# Patient Record
Sex: Female | Born: 1960 | Race: White | Hispanic: No | Smoking: Current every day smoker
Health system: Southern US, Community
[De-identification: ages and names within clinical notes are randomized; demographics above are authoritative.]

## PROBLEM LIST (undated history)

## (undated) DIAGNOSIS — F32A Depression, unspecified: Secondary | ICD-10-CM

## (undated) DIAGNOSIS — E785 Hyperlipidemia, unspecified: Secondary | ICD-10-CM

## (undated) DIAGNOSIS — I2510 Atherosclerotic heart disease of native coronary artery without angina pectoris: Secondary | ICD-10-CM

## (undated) DIAGNOSIS — I5189 Other ill-defined heart diseases: Secondary | ICD-10-CM

## (undated) DIAGNOSIS — Z7901 Long term (current) use of anticoagulants: Secondary | ICD-10-CM

## (undated) DIAGNOSIS — I739 Peripheral vascular disease, unspecified: Secondary | ICD-10-CM

## (undated) DIAGNOSIS — G43909 Migraine, unspecified, not intractable, without status migrainosus: Secondary | ICD-10-CM

## (undated) DIAGNOSIS — M1990 Unspecified osteoarthritis, unspecified site: Secondary | ICD-10-CM

## (undated) DIAGNOSIS — I10 Essential (primary) hypertension: Secondary | ICD-10-CM

## (undated) DIAGNOSIS — E119 Type 2 diabetes mellitus without complications: Secondary | ICD-10-CM

## (undated) DIAGNOSIS — J189 Pneumonia, unspecified organism: Secondary | ICD-10-CM

## (undated) DIAGNOSIS — I219 Acute myocardial infarction, unspecified: Secondary | ICD-10-CM

## (undated) DIAGNOSIS — F419 Anxiety disorder, unspecified: Secondary | ICD-10-CM

## (undated) DIAGNOSIS — K219 Gastro-esophageal reflux disease without esophagitis: Secondary | ICD-10-CM

## (undated) DIAGNOSIS — Z955 Presence of coronary angioplasty implant and graft: Secondary | ICD-10-CM

## (undated) DIAGNOSIS — I771 Stricture of artery: Secondary | ICD-10-CM

## (undated) HISTORY — PX: COLONOSCOPY: SHX174

## (undated) HISTORY — PX: THROAT SURGERY: SHX803

---

## 2013-06-04 ENCOUNTER — Emergency Department: Payer: Self-pay | Admitting: Emergency Medicine

## 2013-06-30 ENCOUNTER — Emergency Department: Payer: Self-pay | Admitting: Emergency Medicine

## 2013-07-12 ENCOUNTER — Ambulatory Visit: Payer: Self-pay | Admitting: Otolaryngology

## 2013-07-13 ENCOUNTER — Ambulatory Visit: Payer: Self-pay | Admitting: Otolaryngology

## 2013-07-13 LAB — BASIC METABOLIC PANEL
Anion Gap: 8 (ref 7–16)
BUN: 13 mg/dL (ref 7–18)
Calcium, Total: 9.6 mg/dL (ref 8.5–10.1)
Chloride: 102 mmol/L (ref 98–107)
EGFR (African American): >60
Glucose: 132 mg/dL — ABNORMAL HIGH (ref 65–99)
Sodium: 137 mmol/L (ref 136–145)

## 2013-07-13 LAB — CBC WITH DIFFERENTIAL/PLATELET
Basophil #: 0.1 10*3/uL (ref 0.0–0.1)
Basophil %: 0.9 %
Eosinophil #: 0.1 10*3/uL (ref 0.0–0.7)
Eosinophil %: 0.8 %
Lymphocyte #: 3.8 10*3/uL — ABNORMAL HIGH (ref 1.0–3.6)
Lymphocyte %: 37.8 %
MCV: 87 fL (ref 80–100)
Monocyte #: 0.7 x10 3/mm (ref 0.2–0.9)
Monocyte %: 6.6 %
Neutrophil #: 5.5 10*3/uL (ref 1.4–6.5)
Neutrophil %: 53.9 %
Platelet: 297 10*3/uL (ref 150–440)
RDW: 13.6 % (ref 11.5–14.5)
WBC: 10.1 10*3/uL (ref 3.6–11.0)

## 2013-07-14 ENCOUNTER — Ambulatory Visit: Payer: Self-pay | Admitting: Otolaryngology

## 2013-07-15 LAB — PATHOLOGY REPORT

## 2013-08-04 LAB — CULTURE, FUNGUS WITHOUT SMEAR

## 2015-01-12 NOTE — Op Note (Signed)
PATIENT NAME:  Emily Krause, Emily Krause MR#:  [id number] DATE OF BIRTH:  1961/07/12  DATE OF PROCEDURE:  07/14/2013  PREOPERATIVE DIAGNOSIS: Laryngeal lesion with prolonged hoarseness.   POSTOPERATIVE DIAGNOSIS: Laryngeal lesion with prolonged hoarseness.    PROCEDURES:  1. Microscopic direct laryngoscopy with biopsy.  2. Rigid esophagoscopy.   SURGEON: Zackery BarefootJ. Madison Francesa Eugenio, Krause.D.   DESCRIPTION OF PROCEDURE: The patient was placed in the supine position on the operating room table. After general endotracheal anesthesia had been induced with a #6 endotracheal tube, the Dedo laryngoscope was used to perform direct laryngoscopy. The entire larynx and supraglottis were very inflamed with whitish-yellow caseous material that was very foul smelling which was suctioned from the left infra-arytenoid mucosa, and there was additional caseous material that was suctioned into a suction trap and sent for culture (aerobic, anaerobic and fungal). Palpation was carried out with the microlaryngeal instrumentation, and the only lesion that was identified was a dome-shaped lesion in the midportion of the left vocal cord. Using microscopic laryngeal instrumentation, this was carefully dissected from the vocal ligament and sent for frozen section analysis. Frozen section analysis was returned as inconclusive. No additional biopsies were taken from the larynx as there were no other discrete mucosal lesions, just generalized edematous inflammation. Rigid esophagoscopy was then performed with the medium-sized Pillsbury-Jesberg esophagoscope. The esophageal mucosa was superiorly similar with pachydermatous inflammation but no adherent caseous material, and in the midportion of the esophagus, the inflammation was significantly less prominent. No discrete esophageal mucosal lesions or extramucosal obstruction. A Neuro Patty with phenylephrine lidocaine was placed against the biopsy site in the left vocal cord and left in place for approximately  5 minutes. No additional bleeding was encountered. Therefore, the Dedo laryngoscope was removed. Palpation was carried out of the palate, tongue, oropharynx and no additional lesions were palpably or visibly identified. The patient was then returned to anesthesia, allowed to emerge from anesthesia in the operating room and taken to the recovery room in stable condition. There were no complications. Estimated blood loss less than 5 mL.   ____________________________ J. Gertie BaronMadison Alyssah Algeo, MD jmc:gb D: 07/14/2013 18:03:37 ET T: 07/14/2013 21:05:07 ET JOB#: [id number]  cc: Zackery BarefootJ. Madison Marsa Matteo, MD, <Dictator> Wendee CoppJMADISON Kimora Stankovic MD ELECTRONICALLY SIGNED 07/22/2013 21:42

## 2015-04-11 ENCOUNTER — Encounter: Payer: Self-pay | Admitting: Emergency Medicine

## 2015-04-11 ENCOUNTER — Emergency Department
Admission: EM | Admit: 2015-04-11 | Discharge: 2015-04-11 | Disposition: A | Discharge status: Home / Self Care | Payer: BLUE CROSS/BLUE SHIELD | Attending: Student | Admitting: Student

## 2015-04-11 ENCOUNTER — Emergency Department: Payer: BLUE CROSS/BLUE SHIELD

## 2015-04-11 ENCOUNTER — Other Ambulatory Visit: Payer: Self-pay

## 2015-04-11 DIAGNOSIS — W503XXA Accidental bite by another person, initial encounter: Secondary | ICD-10-CM | POA: Diagnosis not present

## 2015-04-11 DIAGNOSIS — Y9289 Other specified places as the place of occurrence of the external cause: Secondary | ICD-10-CM | POA: Insufficient documentation

## 2015-04-11 DIAGNOSIS — R51 Headache: Secondary | ICD-10-CM | POA: Insufficient documentation

## 2015-04-11 DIAGNOSIS — S41152A Open bite of left upper arm, initial encounter: Secondary | ICD-10-CM | POA: Diagnosis not present

## 2015-04-11 DIAGNOSIS — Y998 Other external cause status: Secondary | ICD-10-CM | POA: Insufficient documentation

## 2015-04-11 DIAGNOSIS — Z720 Tobacco use: Secondary | ICD-10-CM | POA: Diagnosis not present

## 2015-04-11 DIAGNOSIS — R519 Headache, unspecified: Secondary | ICD-10-CM

## 2015-04-11 DIAGNOSIS — Z79899 Other long term (current) drug therapy: Secondary | ICD-10-CM | POA: Diagnosis not present

## 2015-04-11 DIAGNOSIS — I10 Essential (primary) hypertension: Secondary | ICD-10-CM | POA: Insufficient documentation

## 2015-04-11 DIAGNOSIS — Y9389 Activity, other specified: Secondary | ICD-10-CM | POA: Diagnosis not present

## 2015-04-11 DIAGNOSIS — R000 Tachycardia, unspecified: Secondary | ICD-10-CM | POA: Diagnosis not present

## 2015-04-11 DIAGNOSIS — F0390 Unspecified dementia without behavioral disturbance: Secondary | ICD-10-CM | POA: Insufficient documentation

## 2015-04-11 HISTORY — DX: Migraine, unspecified, not intractable, without status migrainosus: G43.909

## 2015-04-11 HISTORY — DX: Essential (primary) hypertension: I10

## 2015-04-11 MED ORDER — HYDROCHLOROTHIAZIDE 25 MG PO TABS
25.0000 mg | ORAL_TABLET | Freq: Once | ORAL | Status: AC
  Administered 2015-04-11: 25 mg via ORAL
  Filled 2015-04-11: qty 1

## 2015-04-11 MED ORDER — IBUPROFEN 600 MG PO TABS
600.0000 mg | ORAL_TABLET | Freq: Once | ORAL | Status: AC
  Administered 2015-04-11: 600 mg via ORAL
  Filled 2015-04-11: qty 1

## 2015-04-11 MED ORDER — LISINOPRIL 20 MG PO TABS
10.0000 mg | ORAL_TABLET | Freq: Once | ORAL | Status: AC
  Administered 2015-04-11: 10 mg via ORAL
  Filled 2015-04-11: qty 1
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
## 2015-04-11 NOTE — ED Notes (Signed)
Patient transported to CT 

## 2015-04-11 NOTE — ED Notes (Signed)
Patient returned from Ct
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
## 2016-12-04 DIAGNOSIS — Z955 Presence of coronary angioplasty implant and graft: Secondary | ICD-10-CM

## 2016-12-04 DIAGNOSIS — I214 Non-ST elevation (NSTEMI) myocardial infarction: Secondary | ICD-10-CM

## 2016-12-04 HISTORY — DX: Non-ST elevation (NSTEMI) myocardial infarction: I21.4

## 2016-12-04 HISTORY — DX: Presence of coronary angioplasty implant and graft: Z95.5

## 2016-12-04 HISTORY — PX: CORONARY ANGIOPLASTY WITH STENT PLACEMENT: SHX49

## 2018-05-15 ENCOUNTER — Emergency Department
Admission: EM | Admit: 2018-05-15 | Discharge: 2018-05-15 | Disposition: A | Discharge status: Home / Self Care | Payer: BLUE CROSS/BLUE SHIELD | Attending: Emergency Medicine | Admitting: Emergency Medicine

## 2018-05-15 ENCOUNTER — Other Ambulatory Visit: Payer: Self-pay

## 2018-05-15 ENCOUNTER — Emergency Department: Payer: BLUE CROSS/BLUE SHIELD

## 2018-05-15 ENCOUNTER — Encounter: Payer: Self-pay | Admitting: Emergency Medicine

## 2018-05-15 DIAGNOSIS — F17210 Nicotine dependence, cigarettes, uncomplicated: Secondary | ICD-10-CM | POA: Insufficient documentation

## 2018-05-15 DIAGNOSIS — S8002XA Contusion of left knee, initial encounter: Secondary | ICD-10-CM | POA: Insufficient documentation

## 2018-05-15 DIAGNOSIS — S8000XA Contusion of unspecified knee, initial encounter: Secondary | ICD-10-CM

## 2018-05-15 DIAGNOSIS — W19XXXA Unspecified fall, initial encounter: Secondary | ICD-10-CM

## 2018-05-15 DIAGNOSIS — M25561 Pain in right knee: Secondary | ICD-10-CM

## 2018-05-15 DIAGNOSIS — Y92512 Supermarket, store or market as the place of occurrence of the external cause: Secondary | ICD-10-CM | POA: Insufficient documentation

## 2018-05-15 DIAGNOSIS — Y999 Unspecified external cause status: Secondary | ICD-10-CM | POA: Insufficient documentation

## 2018-05-15 DIAGNOSIS — Y939 Activity, unspecified: Secondary | ICD-10-CM | POA: Insufficient documentation

## 2018-05-15 DIAGNOSIS — M25562 Pain in left knee: Secondary | ICD-10-CM

## 2018-05-15 DIAGNOSIS — Z79899 Other long term (current) drug therapy: Secondary | ICD-10-CM | POA: Insufficient documentation

## 2018-05-15 DIAGNOSIS — Z794 Long term (current) use of insulin: Secondary | ICD-10-CM | POA: Insufficient documentation

## 2018-05-15 DIAGNOSIS — S8001XA Contusion of right knee, initial encounter: Secondary | ICD-10-CM | POA: Insufficient documentation

## 2018-05-15 DIAGNOSIS — I10 Essential (primary) hypertension: Secondary | ICD-10-CM | POA: Insufficient documentation

## 2018-05-15 DIAGNOSIS — W010XXA Fall on same level from slipping, tripping and stumbling without subsequent striking against object, initial encounter: Secondary | ICD-10-CM | POA: Insufficient documentation

## 2018-05-15 MED ORDER — TRAMADOL HCL 50 MG PO TABS: 50.0000 mg | ORAL_TABLET | Freq: Two times a day (BID) | ORAL | 0 refills | Status: DC

## 2018-05-15 MED ORDER — HYDROCODONE-ACETAMINOPHEN 5-325 MG PO TABS
1.0000 | ORAL_TABLET | Freq: Once | ORAL | Status: AC
  Administered 2018-05-15: 1 via ORAL
  Filled 2018-05-15: qty 1

## 2018-05-15 NOTE — ED Triage Notes (Signed)
Bilateral knee pain since fell at store approx 1330 today.

## 2018-05-15 NOTE — Discharge Instructions (Addendum)
Your exam and x-rays are negative for acute fracture or dislocation. Take the pain medicine as needed and apply ice to reduce pain and swelling. Follow-up with your provider for ongoing symptoms.

## 2018-05-15 NOTE — ED Provider Notes (Addendum)
[facility] Emergency Department Provider Note ____________________________________________  Time seen: 1622  I have reviewed the triage vital signs and the nursing notes.  HISTORY  Chief Complaint  Knee Pain  HPI Emily Krause is a 57 y.o. female presents to the ED accompanied by her husband, for evaluation of injury sustained following a mechanical fall.  Patient describes tripping while at a local grocery store, while carrying a watermelon.  She apparently got her foot caught on the palate with the fruit were stacked.  She describes falling forward, landing on hands and knees.  She presents with pain to the bilateral knees at this time.  She denies any head injury, loss of consciousness, laceration, or abrasions.  Past Medical History:  Diagnosis Date  . Hypertension   . Migraine     There are no active problems to display for this patient.   History reviewed. No pertinent surgical history.  Prior to Admission medications   Medication Sig Start Date End Date Taking? Authorizing Provider  atorvastatin (LIPITOR) 20 MG tablet Take 20 mg by mouth daily.   Yes [provider]  carvedilol (COREG) 3.125 MG tablet Take 3.125 mg by mouth 2 (two) times daily with a meal.   Yes [provider]  citalopram (CELEXA) 10 MG tablet Take 10 mg by mouth daily.   Yes [provider]  glipiZIDE (GLUCOTROL) 5 MG tablet Take 5 mg by mouth 2 (two) times daily before a meal.   Yes [provider]  insulin detemir (LEVEMIR) 100 UNIT/ML injection Inject 30 Units into the skin daily.   Yes [provider]  linagliptin (TRADJENTA) 5 MG TABS tablet Take 5 mg by mouth daily.   Yes [provider]  metFORMIN (GLUCOPHAGE) 500 MG tablet Take by mouth 2 (two) times daily with a meal.   Yes [provider]  buPROPion (WELLBUTRIN XL) 150 MG 24 hr tablet Take 150 mg by mouth daily.    [provider]   losartan-hydrochlorothiazide (HYZAAR) 100-25 MG per tablet Take 1 tablet by mouth daily.    [provider]  traMADol (ULTRAM) 50 MG tablet Take 1 tablet (50 mg total) by mouth 2 (two) times daily. 05/15/18   Koray Soter, Emily Ivory, PA-C    Allergies Codeine  No family history on file.  Social History Social History   Tobacco Use  . Smoking status: Current Every Day Smoker    Packs/day: 0.50    Types: Cigarettes  Substance Use Topics  . Alcohol use: No  . Drug use: Not on file    Review of Systems  Constitutional: Negative for fever. Eyes: Negative for visual changes. ENT: Negative for sore throat. Cardiovascular: Negative for chest pain. Respiratory: Negative for shortness of breath. Musculoskeletal: Negative for back pain.  Bilateral knee pain as above. Skin: Negative for rash. Neurological: Negative for headaches, focal weakness or numbness. ____________________________________________  PHYSICAL EXAM:  VITAL SIGNS: ED Triage Vitals  Enc Vitals Group     BP 05/15/18 1532 (!) 158/95     Pulse Rate 05/15/18 1532 89     Resp 05/15/18 1532 18     Temp 05/15/18 1532 98.7 F (37.1 C)     Temp Source 05/15/18 1532 Oral     SpO2 05/15/18 1532 98 %     Weight 05/15/18 1535 240 lb (108.9 kg)     Height 05/15/18 1535 5\' 8"  (1.727 m)     Head Circumference --      Peak  Flow --      Pain Score 05/15/18 1535 10     Pain Loc --      Pain Edu? --      Excl. in GC? --     Constitutional: Alert and oriented. Well appearing and in no distress. Head: Normocephalic and atraumatic. Eyes: Conjunctivae are normal. Normal extraocular movements Cardiovascular: Normal rate, regular rhythm. Normal distal pulses. Respiratory: Normal respiratory effort. No wheezes/rales/rhonchi. Musculoskeletal: Bilateral knees without any obvious deformity, dislocation, or effusion.  No overlying erythema or abrasions noted to the skin.  She is mildly tender to palpation to the inferior  patellar poles bilaterally.  Patient with normal tracking to the knees bilaterally.  No valgus or varus joint stress is appreciated.  The popliteal space fullness is noted.  Calf and Achilles tendon are nontender distally.  Nontender with normal range of motion in all other extremities.  Neurologic:  Normal gait without ataxia. Normal speech and language. No gross focal neurologic deficits are appreciated. Skin:  Skin is warm, dry and intact. No rash noted. ____________________________________________   RADIOLOGY  Left Knee IMPRESSION: Negative.  Right Knee IMPRESSION: Negative. ____________________________________________  PROCEDURES  Procedures Norco 5-325 mg PO ____________________________________________  INITIAL IMPRESSION / ASSESSMENT AND PLAN / ED COURSE  Patient with ED evaluation of bilateral knee pain status post mechanical fall.  Patient and her husband are reassured by negative x-rays.  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  Discharged with a prescription for #10 Ultram to dose as directed.  She will follow with primary provider for ongoing symptom management.  I reviewed the patient's prescription history over the last 12 months in the multi-state controlled substances database(s) that includes [place], [place], [place], [place], [place], [place], [place], [place], [place], [place], [place], [place], and [place].  Results were notable for no current prescriptions. ____________________________________________  FINAL CLINICAL IMPRESSION(S) / ED DIAGNOSES  Final diagnoses:  Fall, initial encounter  Contusion of knee, unspecified laterality, initial encounter  Acute pain of both knees      Gregery Walberg, Emily IvoryJenise V Bacon, PA-C 05/15/18 [address]1710    Daryus Sowash, Emily IvoryJenise V Bacon, PA-C 05/15/18 1711    Minna AntisPaduchowski, Kevin, MD 05/15/18 2251

## 2019-10-24 ENCOUNTER — Ambulatory Visit (LOCAL_COMMUNITY_HEALTH_CENTER): Payer: Self-pay

## 2019-10-24 ENCOUNTER — Other Ambulatory Visit: Payer: Self-pay

## 2019-10-24 DIAGNOSIS — Z111 Encounter for screening for respiratory tuberculosis: Secondary | ICD-10-CM

## 2019-10-27 ENCOUNTER — Ambulatory Visit (LOCAL_COMMUNITY_HEALTH_CENTER): Payer: Medicaid Other

## 2019-10-27 ENCOUNTER — Other Ambulatory Visit: Payer: Self-pay

## 2019-10-27 DIAGNOSIS — Z111 Encounter for screening for respiratory tuberculosis: Secondary | ICD-10-CM

## 2019-10-27 LAB — TB SKIN TEST
Induration: 0 mm
TB Skin Test: NEGATIVE

## 2020-02-28 ENCOUNTER — Ambulatory Visit
Admission: EM | Admit: 2020-02-28 | Discharge: 2020-02-28 | Disposition: A | Discharge status: Home / Self Care | Payer: Self-pay | Attending: Emergency Medicine | Admitting: Emergency Medicine

## 2020-02-28 ENCOUNTER — Encounter: Payer: Self-pay | Admitting: Emergency Medicine

## 2020-02-28 ENCOUNTER — Other Ambulatory Visit: Payer: Self-pay

## 2020-02-28 DIAGNOSIS — R05 Cough: Secondary | ICD-10-CM | POA: Insufficient documentation

## 2020-02-28 DIAGNOSIS — R059 Cough, unspecified: Secondary | ICD-10-CM

## 2020-02-28 DIAGNOSIS — H6691 Otitis media, unspecified, right ear: Secondary | ICD-10-CM | POA: Insufficient documentation

## 2020-02-28 DIAGNOSIS — J029 Acute pharyngitis, unspecified: Secondary | ICD-10-CM | POA: Insufficient documentation

## 2020-02-28 HISTORY — DX: Type 2 diabetes mellitus without complications: E11.9

## 2020-02-28 LAB — GROUP A STREP BY PCR: Group A Strep by PCR: NOT DETECTED

## 2020-02-28 MED ORDER — AMOXICILLIN 875 MG PO TABS: 875.0000 mg | ORAL_TABLET | Freq: Two times a day (BID) | ORAL | 0 refills | Status: DC

## 2020-02-28 MED ORDER — LIDOCAINE VISCOUS HCL 2 % MT SOLN: 10.0000 mL | Freq: Three times a day (TID) | OROMUCOSAL | 0 refills | Status: DC | PRN

## 2020-02-28 NOTE — ED Provider Notes (Signed)
[facility] ____________________________________________  Time seen: Approximately 8:23 PM  I have reviewed the triage vital signs and the nursing notes.   HISTORY  Chief Complaint Sore Throat and Cough   HPI Emily Krause is a 59 y.o. female presenting for evaluation of sore throat.  Reports sore throat present for the last 2 days.  States painful to swallow but able to still drink fluids well.  States cold water has helped some.  Also reports some ear discomfort, mild nasal congestion and occasional cough.  States cough is nonproductive.  Denies known fevers.  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  Patient declines COVID-19 testing.  Denies chest pain, shortness of breath, vomiting, diarrhea, fever or changes in taste or smell.  Has been using Hall's cough drops, over-the-counter Tylenol and salt water gargles which helps some but no resolution.  Denies other recent sickness.  Sandrea Hughs, NP : PCP   Past Medical History:  Diagnosis Date  . Diabetes mellitus without complication (HCC)   . Hypertension   . Migraine     There are no problems to display for this patient.   Past Surgical History:  Procedure Laterality Date  . CESAREAN SECTION    . THROAT SURGERY     lymph node     No current facility-administered medications for this encounter.  Current Outpatient Medications:  .  aspirin 81 MG chewable tablet, Chew 1 tablet by mouth daily., Disp: , Rfl:  .  atorvastatin (LIPITOR) 20 MG tablet, Take 20 mg by mouth daily., Disp: , Rfl:  .  carvedilol (COREG) 3.125 MG tablet, Take 3.125 mg by mouth 2 (two) times daily with a meal., Disp: , Rfl:  .  citalopram (CELEXA) 10 MG tablet, Take 10 mg by mouth daily., Disp: , Rfl:  .  cyclobenzaprine (FLEXERIL) 10 MG tablet, Take 1 tablet by mouth every 8 (eight) hours as needed., Disp: , Rfl:  .  insulin detemir (LEVEMIR) 100 UNIT/ML injection, Inject 30 Units into the skin daily.,  Disp: , Rfl:  .  losartan-hydrochlorothiazide (HYZAAR) 100-25 MG per tablet, Take 1 tablet by mouth daily., Disp: , Rfl:  .  omeprazole (PRILOSEC) 20 MG capsule, Take 20 mg by mouth daily., Disp: , Rfl:  .  UNABLE TO FIND, Oral diabetes medicine that replaced Metformin and Glipizide. Patient doesn't know name or strength., Disp: , Rfl:  .  amoxicillin (AMOXIL) 875 MG tablet, Take 1 tablet (875 mg total) by mouth 2 (two) times daily., Disp: 20 tablet, Rfl: 0 .  buPROPion (WELLBUTRIN XL) 150 MG 24 hr tablet, Take 150 mg by mouth daily., Disp: , Rfl:  .  glipiZIDE (GLUCOTROL) 5 MG tablet, Take 5 mg by mouth 2 (two) times daily before a meal., Disp: , Rfl:  .  lidocaine (XYLOCAINE) 2 % solution, Use as directed 10 mLs in the mouth or throat every 8 (eight) hours as needed (sore throat. gargle and spit as needed for sore throat.)., Disp: 100 mL, Rfl: 0 .  linagliptin (TRADJENTA) 5 MG TABS tablet, Take 5 mg by mouth daily., Disp: , Rfl:  .  metFORMIN (GLUCOPHAGE) 500 MG tablet, Take by mouth 2 (two) times daily with a meal., Disp: , Rfl:  .  traMADol (ULTRAM) 50 MG tablet, Take 1 tablet (50 mg total) by mouth 2 (two) times daily., Disp: 10 tablet, Rfl: 0  Allergies Codeine, Lisinopril, Quinapril, and Metoprolol  Family History  Problem Relation Age of Onset  .  Uterine cancer Mother   . Diabetes Father     Social History Social History   Tobacco Use  . Smoking status: Current Every Day Smoker    Packs/day: 0.50    Years: 44.00    Pack years: 22.00    Types: Cigarettes  . Smokeless tobacco: Never Used  Substance Use Topics  . Alcohol use: No  . Drug use: Never    Review of Systems Constitutional: No fever ENT: Positive sore throat Cardiovascular: Denies chest pain. Respiratory: Denies shortness of breath. Gastrointestinal: No abdominal pain.   Skin: Negative for rash.   ____________________________________________   PHYSICAL EXAM:  VITAL SIGNS: ED Triage Vitals  Enc Vitals  Group     BP 02/28/20 1925 (!) 144/81     Pulse Rate 02/28/20 1925 (!) 105     Resp 02/28/20 1925 18     Temp 02/28/20 1925 98.7 F (37.1 C)     Temp Source 02/28/20 1925 Oral     SpO2 02/28/20 1925 97 %     Weight 02/28/20 1926 225 lb (102.1 kg)     Height 02/28/20 1926 5\' 6"  (1.676 m)     Head Circumference --      Peak Flow --      Pain Score 02/28/20 1926 10     Pain Loc --      Pain Edu? --      Excl. in GC? --     Constitutional: Alert and oriented. Well appearing and in no acute distress. Eyes: Conjunctivae are normal.  Head: Atraumatic. No swelling. No erythema.  Ears: Left: Nontender, normal canal, mild effusion present, no erythema, otherwise normal TM.  Right: Nontender, normal canal, effusion present with moderate erythema.  Nose: Mild nasal congestion.  Mouth/Throat: Mucous membranes are moist.  Moderate pharyngeal erythema.  Mild bilateral tonsillar swelling.  No exudate.  No uvular shift or uvular deviation. Neck: No stridor.  No cervical spine tenderness to palpation. Hematological/Lymphatic/Immunilogical: Anterior bilateral cervical lymphadenopathy. Cardiovascular: Normal rate, regular rhythm. Grossly normal heart sounds.  Good peripheral circulation. Respiratory: Normal respiratory effort.  No retractions. No wheezes, rales or rhonchi. Good air movement.  Musculoskeletal: Ambulatory with steady gait.  Neurologic:  Normal speech and language. No gait instability. Skin:  Skin appears warm, dry and intact. No rash noted. Psychiatric: Mood and affect are normal. Speech and behavior are normal.  ___________________________________________   LABS (all labs ordered are listed, but only abnormal results are displayed)  Labs Reviewed  GROUP A STREP BY PCR   ____________________________________________   PROCEDURES Procedures    INITIAL IMPRESSION / ASSESSMENT AND PLAN / ED COURSE  Pertinent labs & imaging results that were available during my care of the  patient were reviewed by me and considered in my medical decision making (see chart for details).  Overall well-appearing patient.  Tolerating fluids in urgent care.  Pharyngitis and above complaints.  Right otitis noted.  Strep negative.  Will treat with oral amoxicillin, as needed viscous lidocaine gargles, continue over-the-counter Tylenol, rest, fluids and supportive care.Discussed indication, risks and benefits of medications with patient.  Patient declined COVID-19 testing.  Discussed follow up with Primary care physician this week. Discussed follow up and return parameters including no resolution or any worsening concerns. Patient verbalized understanding and agreed to plan.   ____________________________________________   FINAL CLINICAL IMPRESSION(S) / ED DIAGNOSES  Final diagnoses:  Pharyngitis, unspecified etiology  Right otitis media, unspecified otitis media type  Cough     ED Discharge  Orders         Ordered    amoxicillin (AMOXIL) 875 MG tablet  2 times daily     02/28/20 2026    lidocaine (XYLOCAINE) 2 % solution  Every 8 hours PRN     02/28/20 2026           Note: This dictation was prepared with Dragon dictation along with smaller phrase technology. Any transcriptional errors that result from this process are unintentional.         Marylene Land, NP 02/28/20 2131

## 2020-02-28 NOTE — ED Triage Notes (Signed)
Patient in today c/o sore throat and cough x 1 day. Patient denies fever. Patient has taken OTC cough drops, Tylenol and salt water gargles. Patient has not gotten covid vaccines and refuses a covid test.

## 2020-02-28 NOTE — Discharge Instructions (Signed)
Take medication as prescribed. Rest. Drink plenty of fluids.  Tylenol as needed.  Follow up with your primary care physician this week as needed. Return to Urgent care for new or worsening concerns.

## 2020-06-16 ENCOUNTER — Emergency Department
Admission: EM | Admit: 2020-06-16 | Discharge: 2020-06-16 | Disposition: A | Discharge status: Home / Self Care | Payer: Medicaid Other | Attending: Emergency Medicine | Admitting: Emergency Medicine

## 2020-06-16 ENCOUNTER — Encounter: Payer: Self-pay | Admitting: Emergency Medicine

## 2020-06-16 ENCOUNTER — Other Ambulatory Visit: Payer: Self-pay

## 2020-06-16 DIAGNOSIS — E119 Type 2 diabetes mellitus without complications: Secondary | ICD-10-CM | POA: Insufficient documentation

## 2020-06-16 DIAGNOSIS — I10 Essential (primary) hypertension: Secondary | ICD-10-CM | POA: Insufficient documentation

## 2020-06-16 DIAGNOSIS — F17210 Nicotine dependence, cigarettes, uncomplicated: Secondary | ICD-10-CM | POA: Insufficient documentation

## 2020-06-16 DIAGNOSIS — Z794 Long term (current) use of insulin: Secondary | ICD-10-CM | POA: Insufficient documentation

## 2020-06-16 DIAGNOSIS — M5431 Sciatica, right side: Secondary | ICD-10-CM

## 2020-06-16 DIAGNOSIS — M5441 Lumbago with sciatica, right side: Secondary | ICD-10-CM | POA: Insufficient documentation

## 2020-06-16 DIAGNOSIS — Z7982 Long term (current) use of aspirin: Secondary | ICD-10-CM | POA: Insufficient documentation

## 2020-06-16 MED ORDER — HYDROCODONE-ACETAMINOPHEN 5-325 MG PO TABS: 1.0000 | ORAL_TABLET | ORAL | 0 refills | Status: DC | PRN

## 2020-06-16 MED ORDER — PREDNISONE 10 MG PO TABS: 10.0000 mg | ORAL_TABLET | Freq: Every day | ORAL | 0 refills | Status: DC

## 2020-06-16 MED ORDER — PREDNISONE 20 MG PO TABS
60.0000 mg | ORAL_TABLET | Freq: Once | ORAL | Status: AC
Start: 2020-06-16 — End: 2020-06-16
  Administered 2020-06-16: 60 mg via ORAL
  Filled 2020-06-16: qty 3

## 2020-06-16 NOTE — ED Provider Notes (Signed)
[facility] EMERGENCY DEPARTMENT Provider Note   CSN: [id number] Arrival date & time: 06/16/20  1638     History Chief Complaint  Patient presents with  . Leg Pain    Emily Krause is a 59 y.o. female thank you presents emergency [facility] evaluation of right lumbar radiculopathy.  She describes pain burning numbness and tingling is been present on the right leg for 5 days.  No weakness or loss of bowel or bladder symptoms.  No fevers back pain or urinary symptoms.  She denies any numbness or tingling in the left lower extremity.  Pain numbness tingling starts in her right buttocks, goes down the posterior thigh and calf and into the lateral aspect of the right foot.  She has tried Tylenol with no improvement.  HPI     Past Medical History:  Diagnosis Date  . Diabetes mellitus without complication (HCC)   . Hypertension   . Migraine     There are no problems to display for this patient.   Past Surgical History:  Procedure Laterality Date  . CESAREAN SECTION    . THROAT SURGERY     lymph node     OB History   No obstetric history on file.     Family History  Problem Relation Age of Onset  . Uterine cancer Mother   . Diabetes Father     Social History   Tobacco Use  . Smoking status: Current Every Day Smoker    Packs/day: 0.50    Years: 44.00    Pack years: 22.00    Types: Cigarettes  . Smokeless tobacco: Never Used  Vaping Use  . Vaping Use: Never used  Substance Use Topics  . Alcohol use: No  . Drug use: Never    Home Medications Prior to Admission medications   Medication Sig Start Date End Date Taking? Authorizing Provider  amoxicillin (AMOXIL) 875 MG tablet Take 1 tablet (875 mg total) by mouth 2 (two) times daily. 02/28/20   Renford Dills, NP  aspirin 81 MG chewable tablet Chew 1 tablet by mouth daily. 12/06/16   [provider]  atorvastatin (LIPITOR) 20 MG tablet Take 20 mg by mouth daily.    [provider]  buPROPion (WELLBUTRIN XL) 150 MG 24 hr tablet Take 150 mg by mouth daily.    [provider]  carvedilol (COREG) 3.125 MG tablet Take 3.125 mg by mouth 2 (two) times daily with a meal.    [provider]  citalopram (CELEXA) 10 MG tablet Take 10 mg by mouth daily.    [provider]  cyclobenzaprine (FLEXERIL) 10 MG tablet Take 1 tablet by mouth every 8 (eight) hours as needed.    [provider]  glipiZIDE (GLUCOTROL) 5 MG tablet Take 5 mg by mouth 2 (two) times daily before a meal.    [provider]  HYDROcodone-acetaminophen (NORCO) 5-325 MG tablet Take 1 tablet by mouth every 4 (four) hours as needed for moderate pain. 06/16/20   Evon Slack, PA-C  insulin detemir (LEVEMIR) 100 UNIT/ML injection Inject 30 Units into the skin daily.    [provider]  lidocaine (XYLOCAINE) 2 % solution Use as directed 10 mLs in the mouth or throat every 8 (eight) hours as needed (sore throat. gargle and spit as needed for sore throat.). 02/28/20   Renford Dills, NP  linagliptin (TRADJENTA) 5 MG TABS tablet Take 5 mg by mouth daily.    [provider]  losartan-hydrochlorothiazide (HYZAAR) 100-25 MG per tablet Take 1 tablet by mouth daily.    [provider]  metFORMIN (GLUCOPHAGE) 500 MG tablet Take by mouth 2 (two) times daily with a meal.    [provider]  omeprazole (PRILOSEC) 20 MG capsule Take 20 mg by mouth daily.    [provider]  predniSONE (DELTASONE) 10 MG tablet Take 1 tablet (10 mg total) by mouth daily. 6,5,4,3,2,1 six day taper 06/16/20   Evon Slack, PA-C  traMADol (ULTRAM) 50 MG tablet Take 1 tablet (50 mg total) by mouth 2 (two) times daily. 05/15/18   [name], [name], PA-C  UNABLE TO FIND Oral diabetes medicine that replaced Metformin and Glipizide. Patient doesn't know name or strength.    [provider]    Allergies    Codeine, Lisinopril, Quinapril, and  Metoprolol  Review of Systems   Review of Systems  Constitutional: Negative for fever.  Respiratory: Negative for shortness of breath.   Cardiovascular: Negative for chest pain.  Gastrointestinal: Negative for nausea and vomiting.  Genitourinary: Negative for difficulty urinating, dysuria and frequency.  Musculoskeletal: Negative for back pain and myalgias.  Neurological: Positive for numbness.    Physical Exam Updated Vital Signs BP (!) 146/102 (BP Location: Left Arm)   Pulse 92   Temp 98.7 F (37.1 C) (Oral)   Resp 16   Ht 5\' 7"  (1.702 m)   Wt 113.4 kg   SpO2 98%   BMI 39.16 kg/m   Physical Exam Constitutional:      Appearance: She is well-developed.  HENT:     Head: Normocephalic and atraumatic.  Eyes:     Conjunctiva/sclera: Conjunctivae normal.  Cardiovascular:     Rate and Rhythm: Normal rate.  Pulmonary:     Effort: Pulmonary effort is normal. No respiratory distress.  Musculoskeletal:     Cervical back: Normal range of motion.     Comments: Spinous process nontender along the lumbar spine with no sacral or SI joint tenderness.  Patient has no swelling warmth erythema.  Normal hip range of motion with no discomfort.  Normal sensation throughout the right lower extremity.  Normal strength with quadriceps, ankle plantarflexion dorsiflexion extensor pollicis longus.  Negative Homans' sign.  Skin:    General: Skin is warm.     Findings: No rash.  Neurological:     Mental Status: She is alert and oriented to person, place, and time.  Psychiatric:        Behavior: Behavior normal.        Thought Content: Thought content normal.     ED Results / Procedures / Treatments   Labs (all labs ordered are listed, but only abnormal results are displayed) Labs Reviewed - No data to display  EKG None  Radiology No results found.  Procedures Procedures (including critical care time)  Medications Ordered in ED Medications  predniSONE (DELTASONE) tablet 60 mg (60  mg Oral Given 06/16/20 2109)    ED Course  I have reviewed the triage vital signs and the nursing notes.  Pertinent labs & imaging results that were available during my care of the patient were reviewed by me and considered in my medical decision making (see chart for details).    MDM Rules/Calculators/A&P                          59 year old female with right lumbar radiculopathy.  No trauma or injury.  No weakness or neurological  deficits.  Vital signs are stable.  She is placed on prednisone taper and given Norco for pain.  She will follow-up PCP orthopedics in 1 week if no improvement. Final Clinical Impression(s) / ED Diagnoses Final diagnoses:  Sciatica of right side    Rx / DC Orders ED Discharge Orders         Ordered    HYDROcodone-acetaminophen (NORCO) 5-325 MG tablet  Every 4 hours PRN        06/16/20 2123    predniSONE (DELTASONE) 10 MG tablet  Daily        06/16/20 2123           Ronnette Juniper 06/16/20 2128    Chesley Noon, MD 06/16/20 2325

## 2020-06-16 NOTE — ED Triage Notes (Signed)
Pt to ED via POV c/o right leg pain. Pt states that the pain goes from her right ankle all the way up into her buttock. Pt states that she has had the leg pain x 5 days, yesterday the pain started radiating into the buttocks. Pt denies any injury to the area. No redness or swelling present. Pt denies hx/o blood clots, not recent surgery or long trips. Pt is in NAD.

## 2020-06-16 NOTE — Discharge Instructions (Signed)
Please take medications as prescribed return to the ER for any worsening symptoms or urgent changes in your health.  Follow-up with orthopedics in 1 week if no improvement.

## 2020-06-16 NOTE — ED Notes (Signed)
See triage note. 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 Pt confirms history of arthritis, bursitis  Pt NAD, able to ambulate with slight limp  Pt reports she is taking aspirin and tylenol, has taken muscle relaxer without relief

## 2021-01-24 ENCOUNTER — Emergency Department
Admission: EM | Admit: 2021-01-24 | Discharge: 2021-01-24 | Disposition: A | Discharge status: Home / Self Care | Payer: Self-pay | Attending: Emergency Medicine | Admitting: Emergency Medicine

## 2021-01-24 ENCOUNTER — Emergency Department: Payer: Self-pay

## 2021-01-24 ENCOUNTER — Other Ambulatory Visit: Payer: Self-pay

## 2021-01-24 DIAGNOSIS — I10 Essential (primary) hypertension: Secondary | ICD-10-CM | POA: Insufficient documentation

## 2021-01-24 DIAGNOSIS — Z79899 Other long term (current) drug therapy: Secondary | ICD-10-CM | POA: Insufficient documentation

## 2021-01-24 DIAGNOSIS — F17210 Nicotine dependence, cigarettes, uncomplicated: Secondary | ICD-10-CM | POA: Insufficient documentation

## 2021-01-24 DIAGNOSIS — E119 Type 2 diabetes mellitus without complications: Secondary | ICD-10-CM | POA: Insufficient documentation

## 2021-01-24 DIAGNOSIS — I739 Peripheral vascular disease, unspecified: Secondary | ICD-10-CM | POA: Insufficient documentation

## 2021-01-24 DIAGNOSIS — Z7982 Long term (current) use of aspirin: Secondary | ICD-10-CM | POA: Insufficient documentation

## 2021-01-24 DIAGNOSIS — Z794 Long term (current) use of insulin: Secondary | ICD-10-CM | POA: Insufficient documentation

## 2021-01-24 DIAGNOSIS — Z7984 Long term (current) use of oral hypoglycemic drugs: Secondary | ICD-10-CM | POA: Insufficient documentation

## 2021-01-24 DIAGNOSIS — M79604 Pain in right leg: Secondary | ICD-10-CM

## 2021-01-24 HISTORY — DX: Acute myocardial infarction, unspecified: I21.9

## 2021-01-24 MED ORDER — LIDOCAINE 5 % EX PTCH
1.0000 | MEDICATED_PATCH | CUTANEOUS | Status: DC
  Administered 2021-01-24: 1 via TRANSDERMAL
  Filled 2021-01-24: qty 1

## 2021-01-24 MED ORDER — OXYCODONE-ACETAMINOPHEN 5-325 MG PO TABS
1.0000 | ORAL_TABLET | Freq: Once | ORAL | Status: AC
  Administered 2021-01-24: 1 via ORAL
  Filled 2021-01-24: qty 1

## 2021-01-24 MED ORDER — OXYCODONE-ACETAMINOPHEN 5-325 MG PO TABS: 1.0000 | ORAL_TABLET | Freq: Four times a day (QID) | ORAL | 0 refills | Status: AC | PRN

## 2021-01-24 NOTE — Discharge Instructions (Addendum)
Read and follow discharge care instructions.  Wear Lidoderm patch for 12 hours.

## 2021-01-24 NOTE — ED Triage Notes (Signed)
Right calf and leg pain for a few weeks. Concerned that she has a blood clot. Pain worse with ambulation. Takes baby ASA and plavix daily. No known injury. Pt alert and oriented X4, cooperative, RR even and unlabored, color WNL. Pt in NAD.

## 2021-01-24 NOTE — ED Notes (Signed)
See triage note  States this pain is like a "charlie horse" to RLE  Pain is from posterior knee and moves into foot  Sx's started about 3 weeks ago

## 2021-01-24 NOTE — ED Provider Notes (Addendum)
[facility] Emergency Department Provider Note   ____________________________________________   Event Date/Time   First MD Initiated Contact with Patient 01/24/21 [phone]     (approximate)  I have reviewed the triage vital signs and the nursing notes.   HISTORY  Chief Complaint Leg Pain    HPI Emily Krause is a 60 y.o. female patient complaining of several weeks of right calf and leg pain.  Patient was concerned she might have a "blood clot".  Patient pain increased with ambulation.  Rates pain as a 10/10.  Described pain as "achy".  States pain feels like a "cramp".  Denies dyspnea or chest pain.  Patient currently takes baby aspirin and Plavix          Past Medical History:  Diagnosis Date  . Diabetes mellitus without complication (HCC)   . Hypertension   . MI (myocardial infarction) (HCC)   . Migraine     There are no problems to display for this patient.   Past Surgical History:  Procedure Laterality Date  . CESAREAN SECTION    . THROAT SURGERY     lymph node    Prior to Admission medications   Medication Sig Start Date End Date Taking? Authorizing Provider  oxyCODONE-acetaminophen (PERCOCET) 5-325 MG tablet Take 1 tablet by mouth every 6 (six) hours as needed for up to 5 days for severe pain. 01/24/21 01/29/21 Yes Joni Reining, PA-C  aspirin 81 MG chewable tablet Chew 1 tablet by mouth daily. 12/06/16   [provider]  atorvastatin (LIPITOR) 20 MG tablet Take 20 mg by mouth daily.    [provider]  buPROPion (WELLBUTRIN XL) 150 MG 24 hr tablet Take 150 mg by mouth daily.    [provider]  carvedilol (COREG) 3.125 MG tablet Take 3.125 mg by mouth 2 (two) times daily with a meal.    [provider]  citalopram (CELEXA) 10 MG tablet Take 10 mg by mouth daily.    [provider]  cyclobenzaprine (FLEXERIL) 10 MG tablet Take 1 tablet by mouth every 8 (eight) hours as needed.    [provider]  glipiZIDE (GLUCOTROL) 5 MG tablet Take 5 mg by mouth 2 (two) times daily before a meal.    [provider]  insulin detemir (LEVEMIR) 100 UNIT/ML injection Inject 30 Units into the skin daily.    [provider]  linagliptin (TRADJENTA) 5 MG TABS tablet Take 5 mg by mouth daily.    [provider]  losartan-hydrochlorothiazide (HYZAAR) 100-25 MG per tablet Take 1 tablet by mouth daily.    [provider]  metFORMIN (GLUCOPHAGE) 500 MG tablet Take by mouth 2 (two) times daily with a meal.    [provider]  omeprazole (PRILOSEC) 20 MG capsule Take 20 mg by mouth daily.    [provider]  traMADol (ULTRAM) 50 MG tablet Take 1 tablet (50 mg total) by mouth 2 (two) times daily. 05/15/18   [name], [name], PA-C  UNABLE TO FIND Oral diabetes medicine that replaced Metformin and Glipizide. Patient doesn't know name or strength.    [provider]    Allergies Codeine, Lisinopril, Quinapril, and Metoprolol  Family History  Problem Relation Age of Onset  . Uterine cancer Mother   . Diabetes Father     Social History Social History   Tobacco Use  . Smoking status: Current Every Day Smoker    Packs/day: 0.50    Years: 44.00  Pack years: 22.00    Types: Cigarettes  . Smokeless tobacco: Never Used  Vaping Use  . Vaping Use: Never used  Substance Use Topics  . Alcohol use: No  . Drug use: Never    Review of Systems Constitutional: No fever/chills Eyes: No visual changes. ENT: No sore throat. Cardiovascular: Denies chest pain. Respiratory: Denies shortness of breath. Gastrointestinal: No abdominal pain.  No nausea, no vomiting.  No diarrhea.  No constipation. Genitourinary: Negative for dysuria. Musculoskeletal: Right lower leg pain. Skin: Negative for rash. Neurological: Negative for headaches, focal weakness or numbness. Endocrine:  Diabetes and hypertension Hematological/Lymphatic:   Allergic/Immunilogical: ACE inhibitor's, codeine, metoprolol. ____________________________________________   PHYSICAL EXAM:  VITAL SIGNS: ED Triage Vitals [01/24/21 0908]  Enc Vitals Group     BP (!) 144/80     Pulse Rate 96     Resp 18     Temp 99.5 F (37.5 C)     Temp Source Oral     SpO2 98 %     Weight 240 lb (108.9 kg)     Height 5\' 7"  (1.702 m)     Head Circumference      Peak Flow      Pain Score 10     Pain Loc      Pain Edu?      Excl. in GC?     Constitutional: Alert and oriented. Well appearing and in no acute distress. Eyes: Conjunctivae are normal. PERRL. EOMI. Head: Atraumatic. Nose: No congestion/rhinnorhea. Mouth/Throat: Mucous membranes are moist.  Oropharynx non-erythematous. Neck: No stridor.  Hematological/Lymphatic/Immunilogical: No cervical lymphadenopathy. Cardiovascular: Normal rate, regular rhythm. Grossly normal heart sounds.  Good peripheral circulation via portable Doppler. Respiratory: Normal respiratory effort.  No retractions. Lungs CTAB. Gastrointestinal: Soft and nontender. No distention. No abdominal bruits. No CVA tenderness. Genitourinary: Deferred Musculoskeletal: No obvious deformity to the right lower leg.  No leg length discrepancy.  Patient is moderate guarding palpation superior aspect of the right calf and popliteal area. Neurologic:  Normal speech and language. No gross focal neurologic deficits are appreciated. No gait instability. Skin:  Skin is warm, dry and intact. No rash noted.  No abrasion, ecchymosis, edema, or erythema. Psychiatric: Mood and affect are normal. Speech and behavior are normal.  ____________________________________________   LABS (all labs ordered are listed, but only abnormal results are displayed)  Labs Reviewed - No data to display ____________________________________________  EKG   ____________________________________________  RADIOLOGY I, , personally viewed and evaluated  these images (plain radiographs) as part of my medical decision making, as well as reviewing the written report by the radiologist.  ED MD interpretation:    Official radiology report(s): Joni Reining Venous Img Lower Unilateral Right  Result Date: 01/24/2021 CLINICAL DATA:  Right calf pain for the past 3 weeks. History of smoking and cervical cancer. Patient is on anticoagulation. Evaluate for DVT. EXAM: RIGHT LOWER EXTREMITY VENOUS DOPPLER ULTRASOUND TECHNIQUE: Gray-scale sonography with graded compression, as well as color Doppler and duplex ultrasound were performed to evaluate the lower extremity deep venous systems from the level of the common femoral vein and including the common femoral, femoral, profunda femoral, popliteal and calf veins including the posterior tibial, peroneal and gastrocnemius veins when visible. The superficial great saphenous vein was also interrogated. Spectral Doppler was utilized to evaluate flow at rest and with distal augmentation maneuvers in the common femoral, femoral and popliteal veins. COMPARISON:  None. FINDINGS: Contralateral Common Femoral Vein: Respiratory phasicity is normal and symmetric with the  symptomatic side. No evidence of thrombus. Normal compressibility. Common Femoral Vein: No evidence of thrombus. Normal compressibility, respiratory phasicity and response to augmentation. Saphenofemoral Junction: No evidence of thrombus. Normal compressibility and flow on color Doppler imaging. Profunda Femoral Vein: No evidence of thrombus. Normal compressibility and flow on color Doppler imaging. Femoral Vein: No evidence of thrombus. Normal compressibility, respiratory phasicity and response to augmentation. Popliteal Vein: No evidence of thrombus. Normal compressibility, respiratory phasicity and response to augmentation. Calf Veins: No evidence of thrombus. Normal compressibility and flow on color Doppler imaging. Superficial Great Saphenous Vein: No evidence of thrombus.  Normal compressibility. Venous Reflux:  None. Other Findings: Minimal amount of eccentric echogenic plaque is seen within the incidentally imaged left common femoral artery (image 3) as well as the proximal aspect of the right superficial femoral artery (image 24). IMPRESSION: 1. No evidence of DVT within the right lower extremity. 2. Incidental note made of atherosclerotic plaque involving the left common femoral artery as well as the proximal aspect of the right superficial femoral artery. Correlation for symptoms of PAD is advised. Further evaluation with the acquisition of ABIs could be performed as indicated. Electronically Signed   By: Simonne Come M.D.   On: 01/24/2021 11:23    ____________________________________________   PROCEDURES  Procedure(s) performed (including Critical Care):  Procedures   ____________________________________________   INITIAL IMPRESSION / ASSESSMENT AND PLAN / ED COURSE  As part of my medical decision making, I reviewed the following data within the electronic MEDICAL RECORD NUMBER         Patient presents with right leg pain for few weeks.  Patient was concerned for blood clot.  Palpable  peripheral pulses.  Audio pulse via bedside Doppler.  Discussed ultrasound findings showing no DVT.  Patient ultrasounds are consistent with peripheral artery disease.  Patient given a consult to cardiology for definitive evaluation and treatment.  Return back to ED if condition worsens.      ____________________________________________   FINAL CLINICAL IMPRESSION(S) / ED DIAGNOSES  Final diagnoses:  Peripheral artery disease (HCC)  Right leg pain     ED Discharge Orders         Ordered    oxyCODONE-acetaminophen (PERCOCET) 5-325 MG tablet  Every 6 hours PRN        01/24/21 1149          *Please note:  Emily Krause was evaluated in Emergency Department on 01/24/2021 for the symptoms described in the history of present illness. She was evaluated in the  context of the global COVID-19 pandemic, which necessitated consideration that the patient might be at risk for infection with the SARS-CoV-2 virus that causes COVID-19. Institutional protocols and algorithms that pertain to the evaluation of patients at risk for COVID-19 are in a state of rapid change based on information released by regulatory bodies including the CDC and federal and state organizations. These policies and algorithms were followed during the patient's care in the ED.  Some ED evaluations and interventions may be delayed as a result of limited staffing during and the pandemic.*   Note:  This document was prepared using Dragon voice recognition software and may include unintentional dictation errors.    Joni Reining, PA-C 01/24/21 1154    Joni Reining, PA-C 02/20/21 0654    Concha Se, MD 02/20/21 [phone]

## 2021-02-01 ENCOUNTER — Other Ambulatory Visit: Payer: Self-pay | Admitting: Internal Medicine

## 2021-02-01 DIAGNOSIS — I25118 Atherosclerotic heart disease of native coronary artery with other forms of angina pectoris: Secondary | ICD-10-CM

## 2021-02-01 DIAGNOSIS — R9431 Abnormal electrocardiogram [ECG] [EKG]: Secondary | ICD-10-CM

## 2021-02-04 ENCOUNTER — Other Ambulatory Visit: Payer: Self-pay | Admitting: Internal Medicine

## 2021-02-04 DIAGNOSIS — I25118 Atherosclerotic heart disease of native coronary artery with other forms of angina pectoris: Secondary | ICD-10-CM

## 2021-02-04 DIAGNOSIS — R9431 Abnormal electrocardiogram [ECG] [EKG]: Secondary | ICD-10-CM

## 2021-02-04 DIAGNOSIS — I70219 Atherosclerosis of native arteries of extremities with intermittent claudication, unspecified extremity: Secondary | ICD-10-CM

## 2021-02-27 ENCOUNTER — Ambulatory Visit
Admission: RE | Admit: 2021-02-27 | Discharge: 2021-02-27 | Disposition: A | Discharge status: Home / Self Care | Payer: Self-pay | Source: Ambulatory Visit | Attending: Internal Medicine | Admitting: Internal Medicine

## 2021-02-27 ENCOUNTER — Ambulatory Visit
Admission: RE | Admit: 2021-02-27 | Discharge: 2021-02-27 | Disposition: A | Discharge status: Home / Self Care | Payer: Medicaid Other | Source: Ambulatory Visit | Attending: Internal Medicine | Admitting: Internal Medicine

## 2021-02-27 ENCOUNTER — Other Ambulatory Visit: Payer: Self-pay

## 2021-02-27 DIAGNOSIS — R9431 Abnormal electrocardiogram [ECG] [EKG]: Secondary | ICD-10-CM

## 2021-02-27 DIAGNOSIS — I70219 Atherosclerosis of native arteries of extremities with intermittent claudication, unspecified extremity: Secondary | ICD-10-CM

## 2021-02-27 DIAGNOSIS — I25118 Atherosclerotic heart disease of native coronary artery with other forms of angina pectoris: Secondary | ICD-10-CM | POA: Insufficient documentation

## 2021-02-27 LAB — ECHOCARDIOGRAM COMPLETE
AV Mean grad: 2 mmHg
AV Peak grad: 3.4 mmHg
Ao pk vel: 0.92 m/s
Area-P 1/2: 3.36 cm2
S' Lateral: 2.34 cm

## 2021-02-27 MED ORDER — TECHNETIUM TC 99M TETROFOSMIN IV KIT
10.0000 | PACK | Freq: Once | INTRAVENOUS | Status: AC | PRN
  Administered 2021-02-27: 10.9 via INTRAVENOUS

## 2021-02-27 MED ORDER — REGADENOSON 0.4 MG/5ML IV SOLN
0.4000 mg | Freq: Once | INTRAVENOUS | Status: AC
  Administered 2021-02-27: 0.4 mg via INTRAVENOUS

## 2021-02-27 MED ORDER — TECHNETIUM TC 99M TETROFOSMIN IV KIT
30.8900 | PACK | Freq: Once | INTRAVENOUS | Status: AC | PRN
  Administered 2021-02-27: 30.89 via INTRAVENOUS

## 2021-02-27 NOTE — Progress Notes (Signed)
*  PRELIMINARY RESULTS* Echocardiogram 2D Echocardiogram has been performed.  Cristela Blue 02/27/2021, 10:44 AM

## 2021-03-12 ENCOUNTER — Ambulatory Visit: Admission: RE | Admit: 2021-03-12 | Payer: Self-pay | Source: Ambulatory Visit

## 2021-03-15 LAB — NM MYOCAR MULTI W/SPECT W/WALL MOTION / EF
Estimated workload: 1 METS
Exercise duration (min): 1 min
Exercise duration (sec): 8 s
LV dias vol: 58 mL (ref 46–106)
LV sys vol: 33 mL
MPHR: 160 {beats}/min
Peak HR: 114 {beats}/min
Percent HR: 71 %
Rest HR: 83 {beats}/min
SDS: 0
SRS: 8
SSS: 2
TID: 0.89

## 2021-04-09 ENCOUNTER — Ambulatory Visit (INDEPENDENT_AMBULATORY_CARE_PROVIDER_SITE_OTHER): Payer: Self-pay | Admitting: Vascular Surgery

## 2021-04-09 ENCOUNTER — Other Ambulatory Visit: Payer: Self-pay

## 2021-04-09 ENCOUNTER — Telehealth (INDEPENDENT_AMBULATORY_CARE_PROVIDER_SITE_OTHER): Payer: Self-pay

## 2021-04-09 DIAGNOSIS — I70219 Atherosclerosis of native arteries of extremities with intermittent claudication, unspecified extremity: Secondary | ICD-10-CM | POA: Insufficient documentation

## 2021-04-09 DIAGNOSIS — I10 Essential (primary) hypertension: Secondary | ICD-10-CM

## 2021-04-09 DIAGNOSIS — E785 Hyperlipidemia, unspecified: Secondary | ICD-10-CM

## 2021-04-09 DIAGNOSIS — I70211 Atherosclerosis of native arteries of extremities with intermittent claudication, right leg: Secondary | ICD-10-CM

## 2021-04-09 DIAGNOSIS — E1140 Type 2 diabetes mellitus with diabetic neuropathy, unspecified: Secondary | ICD-10-CM

## 2021-04-09 DIAGNOSIS — Z794 Long term (current) use of insulin: Secondary | ICD-10-CM

## 2021-04-09 DIAGNOSIS — E119 Type 2 diabetes mellitus without complications: Secondary | ICD-10-CM | POA: Insufficient documentation

## 2021-04-09 NOTE — Assessment & Plan Note (Signed)
She was seen in the emergency department by the ER physicians about 2 months ago for leg pain.  At that time, they did a DVT study which was negative for DVT.  There was an incidental finding of left common femoral artery plaque and proximal right SFA plaque that they reported to be quite significant incidentally on this venous study.  We had originally planned to do ABIs today, but the patient declined the study so we do not have a functional assessment of her lower extremity perfusion.   I had a long discussion today with the patient regarding the pathophysiology and natural history of peripheral arterial disease.  I think she does have significant PAD and this is a significant contributing factor to her symptoms, although I think there is a significant neuropathic and potentially arthritic component as well.  I do think she has marked claudication symptoms in that right leg and I do think angiogram with potential intervention would be of benefit.  We had planned ABIs today but she declined these that would have given [place] more information about the functional flow in her leg.  I discussed the risks and benefits of angiography and possible revascularization.  I discussed some disease is too severe to be treated endovascularly and open surgical therapy would be required.  She voices her understanding and desires to proceed with right leg angiography and possible revascularization.

## 2021-04-09 NOTE — H&P (View-Only) (Signed)
Patient ID: Emily Krause, female   DOB: November 06, 1960, 60 y.o.   MRN: [id number]  Chief Complaint  Patient presents with   New Patient (Initial Visit)    NP Dr. Gwen Pounds dx. Claudication. [place] and consult. Had US done on 01/24/2021 at ED     HPI Emily Krause is a 60 y.o. female.  I am asked to see the patient by Dr. Gwen Pounds for evaluation of PAD and leg pain.  The patient reports months of worsening lower extremity pain particular with activity.  The right leg is the more severely affected the 2 legs.  She says she can only walk very short distances before having to stop and rest.  This starts in her thigh and radiates down to her calf to her foot.  She reports no open wounds or ulcerations.  No fevers or chills.  She has multiple atherosclerotic risk factors including diabetes, hypertension, and tobacco use.  She was seen in the emergency department by the ER physicians about 2 months ago for leg pain.  At that time, they did a DVT study which was negative for DVT.  There was an incidental finding of left common femoral artery plaque and proximal right SFA plaque that they reported to be quite significant incidentally on this venous study.  We had originally planned to do ABIs today, but the patient declined the study so we do not have a functional assessment of her lower extremity perfusion.     Past Medical History:  Diagnosis Date   Diabetes mellitus without complication (HCC)    Hypertension    MI (myocardial infarction) (HCC)    Migraine     Past Surgical History:  Procedure Laterality Date   CESAREAN SECTION     THROAT SURGERY     lymph node     Family History  Problem Relation Age of Onset   Uterine cancer Mother    Diabetes Father   No bleeding or clotting disorders   Social History   Tobacco Use   Smoking status: Every Day    Packs/day: 0.50    Years: 44.00    Pack years: 22.00    Types: Cigarettes   Smokeless tobacco: Never  Vaping Use   Vaping Use: Never  used  Substance Use Topics   Alcohol use: No   Drug use: Never     Allergies  Allergen Reactions   Codeine Other (See Comments)    headache Other reaction(s): Other (See Comments) Other reaction(s): severe HA   Lisinopril Other (See Comments)   Quinapril     Other reaction(s): Other (See Comments) Other reaction(s): ? myalgias   Metoprolol     Other reaction(s): Other (See Comments) Oral and pharynx blisters    Current Outpatient Medications  Medication Sig Dispense Refill   amLODipine (NORVASC) 10 MG tablet Take by mouth.     aspirin 81 MG chewable tablet Chew 1 tablet by mouth daily.     atorvastatin (LIPITOR) 20 MG tablet Take 20 mg by mouth daily.     atorvastatin (LIPITOR) 80 MG tablet Take by mouth.     buPROPion (WELLBUTRIN XL) 150 MG 24 hr tablet Take 150 mg by mouth daily.     carvedilol (COREG) 3.125 MG tablet Take 3.125 mg by mouth 2 (two) times daily with a meal.     citalopram (CELEXA) 10 MG tablet Take 10 mg by mouth daily.     clopidogrel (PLAVIX) 75 MG tablet Take 1 tablet by mouth  daily.     cyclobenzaprine (FLEXERIL) 10 MG tablet Take 1 tablet by mouth every 8 (eight) hours as needed.     glipiZIDE (GLUCOTROL) 5 MG tablet Take 5 mg by mouth 2 (two) times daily before a meal.     insulin detemir (LEVEMIR) 100 UNIT/ML injection Inject 30 Units into the skin daily.     linagliptin (TRADJENTA) 5 MG TABS tablet Take 5 mg by mouth daily.     losartan-hydrochlorothiazide (HYZAAR) 100-25 MG per tablet Take 1 tablet by mouth daily.     metFORMIN (GLUCOPHAGE) 500 MG tablet Take by mouth 2 (two) times daily with a meal.     NITROSTAT 0.4 MG SL tablet Place under the tongue.     omeprazole (PRILOSEC) 20 MG capsule Take 20 mg by mouth daily.     oxyCODONE-acetaminophen (PERCOCET/ROXICET) 5-325 MG tablet Take 1 tablet by mouth 2 (two) times daily as needed.     PARoxetine (PAXIL) 40 MG tablet Take by mouth.     sitaGLIPtin-metformin (JANUMET) 50-1000 MG tablet Take 1  tablet by mouth 2 (two) times daily.     traMADol (ULTRAM) 50 MG tablet Take 1 tablet (50 mg total) by mouth 2 (two) times daily. 10 tablet 0   UNABLE TO FIND Oral diabetes medicine that replaced Metformin and Glipizide. Patient doesn't know name or strength.     No current facility-administered medications for this visit.      REVIEW OF SYSTEMS (Negative unless checked)  Constitutional: Weight loss  Fever  Chills Cardiac: Chest pain   Chest pressure   Palpitations   Shortness of breath when laying flat   Shortness of breath at rest   Shortness of breath with exertion. Vascular:  Pain in legs with walking   Pain in legs at rest   Pain in legs when laying flat   Claudication   Pain in feet when walking  Pain in feet at rest  Pain in feet when laying flat   History of DVT   Phlebitis   Swelling in legs   Varicose veins   Non-healing ulcers Pulmonary:   Uses home oxygen   Productive cough   Hemoptysis   Wheeze  COPD   Asthma Neurologic:  Dizziness  Blackouts   Seizures   History of stroke   History of TIA  Aphasia   Temporary blindness   Dysphagia   Weakness or numbness in arms   Weakness or numbness in legs Musculoskeletal:  Arthritis   Joint swelling   Joint pain   Low back pain Hematologic:  Easy bruising  Easy bleeding   Hypercoagulable state   Anemic  Hepatitis Gastrointestinal:  Blood in stool   Vomiting blood  Gastroesophageal reflux/heartburn   Abdominal pain Genitourinary:  Chronic kidney disease   Difficult urination  Frequent urination  Burning with urination   Hematuria Skin:  Rashes   Ulcers   Wounds Psychological:  History of anxiety    History of major depression.    Physical Exam BP (!) 161/77   Pulse (!) 123   Ht  (1.702 m)   Wt 229 lb (103.9 kg)   BMI 35.87 kg/m  Gen:  WD/WN, NAD. Appears older than stated age. Head: [place]/AT, No temporalis  wasting.  Ear/Nose/Throat: Hearing grossly intact, nares w/o erythema or drainage, oropharynx w/o Erythema/Exudate Eyes: Conjunctiva clear, sclera non-icteric  Neck: trachea midline.  No JVD.  Pulmonary:  Good air movement, respirations not labored, no use of accessory muscles  Cardiac: tachycardic  Vascular:  Vessel Right Left  Radial Palpable Palpable                          DP 1+ 1+  PT 1+ 1+   Gastrointestinal:. No masses, surgical incisions, or scars. Musculoskeletal: M/S 5/5 throughout.  Extremities without ischemic changes.  No deformity or atrophy. Trace BLE edema. Neurologic: Sensation grossly intact in extremities.  Symmetrical.  Speech is fluent. Motor exam as listed above. Psychiatric: Judgment intact, Mood & affect appropriate for pt's clinical situation. Dermatologic: No rashes or ulcers noted.  No cellulitis or open wounds.    Radiology No results found.  Labs Recent Results (from the past 2160 hour(s))  ECHOCARDIOGRAM COMPLETE     Status: None   Collection Time: 02/27/21 10:44 AM  Result Value Ref Range   Ao pk vel 0.92 m/s   AV Mean grad 2.0 mmHg   AV Peak grad 3.4 mmHg   S' Lateral 2.34 cm   Area-P 1/2 3.36 cm2  NM Myocar Multi W/Spect W/Wall Motion / EF     Status: None   Collection Time: 02/27/21 11:13 AM  Result Value Ref Range   SSS 2    SRS 8    SDS 0    TID 0.89    LV sys vol 33 mL   LV dias vol 58 46 - 106 mL   Rest HR 83 bpm   Rest BP 138/67 mmHg   Exercise duration (sec) 8 sec   Percent HR 71 %   Exercise duration (min) 1 min   Estimated workload 1.0 METS   Peak HR 114 bpm   Peak BP 168/70 mmHg   MPHR 160 bpm    Assessment/Plan:  Atherosclerosis of native arteries of extremity with intermittent claudication (HCC) She was seen in the emergency department by the ER physicians about 2 months ago for leg pain.  At that time, they did a DVT study which was negative for DVT.  There was an incidental finding of left common femoral  artery plaque and proximal right SFA plaque that they reported to be quite significant incidentally on this venous study.  We had originally planned to do ABIs today, but the patient declined the study so we do not have a functional assessment of her lower extremity perfusion.   I had a long discussion today with the patient regarding the pathophysiology and natural history of peripheral arterial disease.  I think she does have significant PAD and this is a significant contributing factor to her symptoms, although I think there is a significant neuropathic and potentially arthritic component as well.  I do think she has marked claudication symptoms in that right leg and I do think angiogram with potential intervention would be of benefit.  We had planned ABIs today but she declined these that would have given [place] more information about the functional flow in her leg.  I discussed the risks and benefits of angiography and possible revascularization.  I discussed some disease is too severe to be treated endovascularly and open surgical therapy would be required.  She voices her understanding and desires to proceed with right leg angiography and possible revascularization.  Essential hypertension, benign blood pressure control important in reducing the progression of atherosclerotic disease. On appropriate oral medications.   Diabetes (HCC) blood glucose control important in reducing the progression of atherosclerotic disease. Also, involved in wound healing. On appropriate medications.   Hyperlipidemia lipid control important in reducing  the progression of atherosclerotic disease. Continue statin therapy      [name] 04/09/2021, 4:16 PM   This note was created with Dragon medical transcription system.  Any errors from dictation are unintentional.

## 2021-04-09 NOTE — H&P (View-Only) (Signed)
Patient ID: Emily Krause, female   DOB: November 06, 1960, 60 y.o.   MRN: [id number]  Chief Complaint  Patient presents with   New Patient (Initial Visit)    NP Dr. Gwen Pounds dx. Claudication. [place] and consult. Had US done on 01/24/2021 at ED     HPI Emily Krause is a 60 y.o. female.  I am asked to see the patient by Dr. Gwen Pounds for evaluation of PAD and leg pain.  The patient reports months of worsening lower extremity pain particular with activity.  The right leg is the more severely affected the 2 legs.  She says she can only walk very short distances before having to stop and rest.  This starts in her thigh and radiates down to her calf to her foot.  She reports no open wounds or ulcerations.  No fevers or chills.  She has multiple atherosclerotic risk factors including diabetes, hypertension, and tobacco use.  She was seen in the emergency department by the ER physicians about 2 months ago for leg pain.  At that time, they did a DVT study which was negative for DVT.  There was an incidental finding of left common femoral artery plaque and proximal right SFA plaque that they reported to be quite significant incidentally on this venous study.  We had originally planned to do ABIs today, but the patient declined the study so we do not have a functional assessment of her lower extremity perfusion.     Past Medical History:  Diagnosis Date   Diabetes mellitus without complication (HCC)    Hypertension    MI (myocardial infarction) (HCC)    Migraine     Past Surgical History:  Procedure Laterality Date   CESAREAN SECTION     THROAT SURGERY     lymph node     Family History  Problem Relation Age of Onset   Uterine cancer Mother    Diabetes Father   No bleeding or clotting disorders   Social History   Tobacco Use   Smoking status: Every Day    Packs/day: 0.50    Years: 44.00    Pack years: 22.00    Types: Cigarettes   Smokeless tobacco: Never  Vaping Use   Vaping Use: Never  used  Substance Use Topics   Alcohol use: No   Drug use: Never     Allergies  Allergen Reactions   Codeine Other (See Comments)    headache Other reaction(s): Other (See Comments) Other reaction(s): severe HA   Lisinopril Other (See Comments)   Quinapril     Other reaction(s): Other (See Comments) Other reaction(s): ? myalgias   Metoprolol     Other reaction(s): Other (See Comments) Oral and pharynx blisters    Current Outpatient Medications  Medication Sig Dispense Refill   amLODipine (NORVASC) 10 MG tablet Take by mouth.     aspirin 81 MG chewable tablet Chew 1 tablet by mouth daily.     atorvastatin (LIPITOR) 20 MG tablet Take 20 mg by mouth daily.     atorvastatin (LIPITOR) 80 MG tablet Take by mouth.     buPROPion (WELLBUTRIN XL) 150 MG 24 hr tablet Take 150 mg by mouth daily.     carvedilol (COREG) 3.125 MG tablet Take 3.125 mg by mouth 2 (two) times daily with a meal.     citalopram (CELEXA) 10 MG tablet Take 10 mg by mouth daily.     clopidogrel (PLAVIX) 75 MG tablet Take 1 tablet by mouth  daily.     cyclobenzaprine (FLEXERIL) 10 MG tablet Take 1 tablet by mouth every 8 (eight) hours as needed.     glipiZIDE (GLUCOTROL) 5 MG tablet Take 5 mg by mouth 2 (two) times daily before a meal.     insulin detemir (LEVEMIR) 100 UNIT/ML injection Inject 30 Units into the skin daily.     linagliptin (TRADJENTA) 5 MG TABS tablet Take 5 mg by mouth daily.     losartan-hydrochlorothiazide (HYZAAR) 100-25 MG per tablet Take 1 tablet by mouth daily.     metFORMIN (GLUCOPHAGE) 500 MG tablet Take by mouth 2 (two) times daily with a meal.     NITROSTAT 0.4 MG SL tablet Place under the tongue.     omeprazole (PRILOSEC) 20 MG capsule Take 20 mg by mouth daily.     oxyCODONE-acetaminophen (PERCOCET/ROXICET) 5-325 MG tablet Take 1 tablet by mouth 2 (two) times daily as needed.     PARoxetine (PAXIL) 40 MG tablet Take by mouth.     sitaGLIPtin-metformin (JANUMET) 50-1000 MG tablet Take 1  tablet by mouth 2 (two) times daily.     traMADol (ULTRAM) 50 MG tablet Take 1 tablet (50 mg total) by mouth 2 (two) times daily. 10 tablet 0   UNABLE TO FIND Oral diabetes medicine that replaced Metformin and Glipizide. Patient doesn't know name or strength.     No current facility-administered medications for this visit.      REVIEW OF SYSTEMS (Negative unless checked)  Constitutional: []Weight loss  []Fever  []Chills Cardiac: []Chest pain   []Chest pressure   []Palpitations   []Shortness of breath when laying flat   []Shortness of breath at rest   [x]Shortness of breath with exertion. Vascular:  [x]Pain in legs with walking   []Pain in legs at rest   []Pain in legs when laying flat   [x]Claudication   []Pain in feet when walking  []Pain in feet at rest  []Pain in feet when laying flat   []History of DVT   []Phlebitis   [x]Swelling in legs   []Varicose veins   []Non-healing ulcers Pulmonary:   []Uses home oxygen   []Productive cough   []Hemoptysis   []Wheeze  []COPD   []Asthma Neurologic:  []Dizziness  []Blackouts   []Seizures   []History of stroke   []History of TIA  []Aphasia   []Temporary blindness   []Dysphagia   []Weakness or numbness in arms   []Weakness or numbness in legs Musculoskeletal:  [x]Arthritis   []Joint swelling   [x]Joint pain   []Low back pain Hematologic:  []Easy bruising  []Easy bleeding   []Hypercoagulable state   []Anemic  []Hepatitis Gastrointestinal:  []Blood in stool   []Vomiting blood  []Gastroesophageal reflux/heartburn   []Abdominal pain Genitourinary:  []Chronic kidney disease   []Difficult urination  []Frequent urination  []Burning with urination   []Hematuria Skin:  []Rashes   []Ulcers   []Wounds Psychological:  []History of anxiety   [] History of major depression.    Physical Exam BP (!) 161/77   Pulse (!) 123   Ht 5' 7" (1.702 m)   Wt 229 lb (103.9 kg)   BMI 35.87 kg/m  Gen:  WD/WN, NAD. Appears older than stated age. Head: [place]/AT, No temporalis  wasting.  Ear/Nose/Throat: Hearing grossly intact, nares w/o erythema or drainage, oropharynx w/o Erythema/Exudate Eyes: Conjunctiva clear, sclera non-icteric  Neck: trachea midline.  No JVD.  Pulmonary:  Good air movement, respirations not labored, no use of accessory muscles  Cardiac: tachycardic   Vascular:  Vessel Right Left  Radial Palpable Palpable                          DP 1+ 1+  PT 1+ 1+   Gastrointestinal:. No masses, surgical incisions, or scars. Musculoskeletal: M/S 5/5 throughout.  Extremities without ischemic changes.  No deformity or atrophy. Trace BLE edema. Neurologic: Sensation grossly intact in extremities.  Symmetrical.  Speech is fluent. Motor exam as listed above. Psychiatric: Judgment intact, Mood & affect appropriate for pt's clinical situation. Dermatologic: No rashes or ulcers noted.  No cellulitis or open wounds.    Radiology No results found.  Labs Recent Results (from the past 2160 hour(s))  ECHOCARDIOGRAM COMPLETE     Status: None   Collection Time: 02/27/21 10:44 AM  Result Value Ref Range   Ao pk vel 0.92 m/s   AV Mean grad 2.0 mmHg   AV Peak grad 3.4 mmHg   S' Lateral 2.34 cm   Area-P 1/2 3.36 cm2  NM Myocar Multi W/Spect W/Wall Motion / EF     Status: None   Collection Time: 02/27/21 11:13 AM  Result Value Ref Range   SSS 2    SRS 8    SDS 0    TID 0.89    LV sys vol 33 mL   LV dias vol 58 46 - 106 mL   Rest HR 83 bpm   Rest BP 138/67 mmHg   Exercise duration (sec) 8 sec   Percent HR 71 %   Exercise duration (min) 1 min   Estimated workload 1.0 METS   Peak HR 114 bpm   Peak BP 168/70 mmHg   MPHR 160 bpm    Assessment/Plan:  Atherosclerosis of native arteries of extremity with intermittent claudication (HCC) She was seen in the emergency department by the ER physicians about 2 months ago for leg pain.  At that time, they did a DVT study which was negative for DVT.  There was an incidental finding of left common femoral  artery plaque and proximal right SFA plaque that they reported to be quite significant incidentally on this venous study.  We had originally planned to do ABIs today, but the patient declined the study so we do not have a functional assessment of her lower extremity perfusion.   I had a long discussion today with the patient regarding the pathophysiology and natural history of peripheral arterial disease.  I think she does have significant PAD and this is a significant contributing factor to her symptoms, although I think there is a significant neuropathic and potentially arthritic component as well.  I do think she has marked claudication symptoms in that right leg and I do think angiogram with potential intervention would be of benefit.  We had planned ABIs today but she declined these that would have given [place] more information about the functional flow in her leg.  I discussed the risks and benefits of angiography and possible revascularization.  I discussed some disease is too severe to be treated endovascularly and open surgical therapy would be required.  She voices her understanding and desires to proceed with right leg angiography and possible revascularization.  Essential hypertension, benign blood pressure control important in reducing the progression of atherosclerotic disease. On appropriate oral medications.   Diabetes (HCC) blood glucose control important in reducing the progression of atherosclerotic disease. Also, involved in wound healing. On appropriate medications.   Hyperlipidemia lipid control important in reducing  the progression of atherosclerotic disease. Continue statin therapy      [name] 04/09/2021, 4:16 PM   This note was created with Dragon medical transcription system.  Any errors from dictation are unintentional.

## 2021-04-09 NOTE — Telephone Encounter (Signed)
Spoke with the patient and she is scheduled with Dr. Wyn Quaker for a right leg angio on 04/15/21 with a 8:00 am arrival time to the [facility]. Pre-procedure instructions were discussed and patient stated she did not take insulin or Metformin when I inquired. Instructions will be mailed to patient.

## 2021-04-09 NOTE — Progress Notes (Signed)
Patient ID: Emily Krause, female   DOB: November 06, 1960, 60 y.o.   MRN: [id number]  Chief Complaint  Patient presents with   New Patient (Initial Visit)    NP Dr. Gwen Pounds dx. Claudication. [place] and consult. Had US done on 01/24/2021 at ED     HPI Emily Krause is a 60 y.o. female.  I am asked to see the patient by Dr. Gwen Pounds for evaluation of PAD and leg pain.  The patient reports months of worsening lower extremity pain particular with activity.  The right leg is the more severely affected the 2 legs.  She says she can only walk very short distances before having to stop and rest.  This starts in her thigh and radiates down to her calf to her foot.  She reports no open wounds or ulcerations.  No fevers or chills.  She has multiple atherosclerotic risk factors including diabetes, hypertension, and tobacco use.  She was seen in the emergency department by the ER physicians about 2 months ago for leg pain.  At that time, they did a DVT study which was negative for DVT.  There was an incidental finding of left common femoral artery plaque and proximal right SFA plaque that they reported to be quite significant incidentally on this venous study.  We had originally planned to do ABIs today, but the patient declined the study so we do not have a functional assessment of her lower extremity perfusion.     Past Medical History:  Diagnosis Date   Diabetes mellitus without complication (HCC)    Hypertension    MI (myocardial infarction) (HCC)    Migraine     Past Surgical History:  Procedure Laterality Date   CESAREAN SECTION     THROAT SURGERY     lymph node     Family History  Problem Relation Age of Onset   Uterine cancer Mother    Diabetes Father   No bleeding or clotting disorders   Social History   Tobacco Use   Smoking status: Every Day    Packs/day: 0.50    Years: 44.00    Pack years: 22.00    Types: Cigarettes   Smokeless tobacco: Never  Vaping Use   Vaping Use: Never  used  Substance Use Topics   Alcohol use: No   Drug use: Never     Allergies  Allergen Reactions   Codeine Other (See Comments)    headache Other reaction(s): Other (See Comments) Other reaction(s): severe HA   Lisinopril Other (See Comments)   Quinapril     Other reaction(s): Other (See Comments) Other reaction(s): ? myalgias   Metoprolol     Other reaction(s): Other (See Comments) Oral and pharynx blisters    Current Outpatient Medications  Medication Sig Dispense Refill   amLODipine (NORVASC) 10 MG tablet Take by mouth.     aspirin 81 MG chewable tablet Chew 1 tablet by mouth daily.     atorvastatin (LIPITOR) 20 MG tablet Take 20 mg by mouth daily.     atorvastatin (LIPITOR) 80 MG tablet Take by mouth.     buPROPion (WELLBUTRIN XL) 150 MG 24 hr tablet Take 150 mg by mouth daily.     carvedilol (COREG) 3.125 MG tablet Take 3.125 mg by mouth 2 (two) times daily with a meal.     citalopram (CELEXA) 10 MG tablet Take 10 mg by mouth daily.     clopidogrel (PLAVIX) 75 MG tablet Take 1 tablet by mouth  daily.     cyclobenzaprine (FLEXERIL) 10 MG tablet Take 1 tablet by mouth every 8 (eight) hours as needed.     glipiZIDE (GLUCOTROL) 5 MG tablet Take 5 mg by mouth 2 (two) times daily before a meal.     insulin detemir (LEVEMIR) 100 UNIT/ML injection Inject 30 Units into the skin daily.     linagliptin (TRADJENTA) 5 MG TABS tablet Take 5 mg by mouth daily.     losartan-hydrochlorothiazide (HYZAAR) 100-25 MG per tablet Take 1 tablet by mouth daily.     metFORMIN (GLUCOPHAGE) 500 MG tablet Take by mouth 2 (two) times daily with a meal.     NITROSTAT 0.4 MG SL tablet Place under the tongue.     omeprazole (PRILOSEC) 20 MG capsule Take 20 mg by mouth daily.     oxyCODONE-acetaminophen (PERCOCET/ROXICET) 5-325 MG tablet Take 1 tablet by mouth 2 (two) times daily as needed.     PARoxetine (PAXIL) 40 MG tablet Take by mouth.     sitaGLIPtin-metformin (JANUMET) 50-1000 MG tablet Take 1  tablet by mouth 2 (two) times daily.     traMADol (ULTRAM) 50 MG tablet Take 1 tablet (50 mg total) by mouth 2 (two) times daily. 10 tablet 0   UNABLE TO FIND Oral diabetes medicine that replaced Metformin and Glipizide. Patient doesn't know name or strength.     No current facility-administered medications for this visit.      REVIEW OF SYSTEMS (Negative unless checked)  Constitutional: []Weight loss  []Fever  []Chills Cardiac: []Chest pain   []Chest pressure   []Palpitations   []Shortness of breath when laying flat   []Shortness of breath at rest   [x]Shortness of breath with exertion. Vascular:  [x]Pain in legs with walking   []Pain in legs at rest   []Pain in legs when laying flat   [x]Claudication   []Pain in feet when walking  []Pain in feet at rest  []Pain in feet when laying flat   []History of DVT   []Phlebitis   [x]Swelling in legs   []Varicose veins   []Non-healing ulcers Pulmonary:   []Uses home oxygen   []Productive cough   []Hemoptysis   []Wheeze  []COPD   []Asthma Neurologic:  []Dizziness  []Blackouts   []Seizures   []History of stroke   []History of TIA  []Aphasia   []Temporary blindness   []Dysphagia   []Weakness or numbness in arms   []Weakness or numbness in legs Musculoskeletal:  [x]Arthritis   []Joint swelling   [x]Joint pain   []Low back pain Hematologic:  []Easy bruising  []Easy bleeding   []Hypercoagulable state   []Anemic  []Hepatitis Gastrointestinal:  []Blood in stool   []Vomiting blood  []Gastroesophageal reflux/heartburn   []Abdominal pain Genitourinary:  []Chronic kidney disease   []Difficult urination  []Frequent urination  []Burning with urination   []Hematuria Skin:  []Rashes   []Ulcers   []Wounds Psychological:  []History of anxiety   [] History of major depression.    Physical Exam BP (!) 161/77   Pulse (!) 123   Ht 5' 7" (1.702 m)   Wt 229 lb (103.9 kg)   BMI 35.87 kg/m  Gen:  WD/WN, NAD. Appears older than stated age. Head: [place]/AT, No temporalis  wasting.  Ear/Nose/Throat: Hearing grossly intact, nares w/o erythema or drainage, oropharynx w/o Erythema/Exudate Eyes: Conjunctiva clear, sclera non-icteric  Neck: trachea midline.  No JVD.  Pulmonary:  Good air movement, respirations not labored, no use of accessory muscles  Cardiac: tachycardic   Vascular:  Vessel Right Left  Radial Palpable Palpable                          DP 1+ 1+  PT 1+ 1+   Gastrointestinal:. No masses, surgical incisions, or scars. Musculoskeletal: M/S 5/5 throughout.  Extremities without ischemic changes.  No deformity or atrophy. Trace BLE edema. Neurologic: Sensation grossly intact in extremities.  Symmetrical.  Speech is fluent. Motor exam as listed above. Psychiatric: Judgment intact, Mood & affect appropriate for pt's clinical situation. Dermatologic: No rashes or ulcers noted.  No cellulitis or open wounds.    Radiology No results found.  Labs Recent Results (from the past 2160 hour(s))  ECHOCARDIOGRAM COMPLETE     Status: None   Collection Time: 02/27/21 10:44 AM  Result Value Ref Range   Ao pk vel 0.92 m/s   AV Mean grad 2.0 mmHg   AV Peak grad 3.4 mmHg   S' Lateral 2.34 cm   Area-P 1/2 3.36 cm2  NM Myocar Multi W/Spect W/Wall Motion / EF     Status: None   Collection Time: 02/27/21 11:13 AM  Result Value Ref Range   SSS 2    SRS 8    SDS 0    TID 0.89    LV sys vol 33 mL   LV dias vol 58 46 - 106 mL   Rest HR 83 bpm   Rest BP 138/67 mmHg   Exercise duration (sec) 8 sec   Percent HR 71 %   Exercise duration (min) 1 min   Estimated workload 1.0 METS   Peak HR 114 bpm   Peak BP 168/70 mmHg   MPHR 160 bpm    Assessment/Plan:  Atherosclerosis of native arteries of extremity with intermittent claudication (HCC) She was seen in the emergency department by the ER physicians about 2 months ago for leg pain.  At that time, they did a DVT study which was negative for DVT.  There was an incidental finding of left common femoral  artery plaque and proximal right SFA plaque that they reported to be quite significant incidentally on this venous study.  We had originally planned to do ABIs today, but the patient declined the study so we do not have a functional assessment of her lower extremity perfusion.   I had a long discussion today with the patient regarding the pathophysiology and natural history of peripheral arterial disease.  I think she does have significant PAD and this is a significant contributing factor to her symptoms, although I think there is a significant neuropathic and potentially arthritic component as well.  I do think she has marked claudication symptoms in that right leg and I do think angiogram with potential intervention would be of benefit.  We had planned ABIs today but she declined these that would have given [place] more information about the functional flow in her leg.  I discussed the risks and benefits of angiography and possible revascularization.  I discussed some disease is too severe to be treated endovascularly and open surgical therapy would be required.  She voices her understanding and desires to proceed with right leg angiography and possible revascularization.  Essential hypertension, benign blood pressure control important in reducing the progression of atherosclerotic disease. On appropriate oral medications.   Diabetes (HCC) blood glucose control important in reducing the progression of atherosclerotic disease. Also, involved in wound healing. On appropriate medications.   Hyperlipidemia lipid control important in reducing  the progression of atherosclerotic disease. Continue statin therapy      [name] 04/09/2021, 4:16 PM   This note was created with Dragon medical transcription system.  Any errors from dictation are unintentional.

## 2021-04-09 NOTE — Patient Instructions (Signed)
Endovascular Therapy for Peripheral Vascular Disease, Care After The following information offers guidance on how to care for yourself after your procedure. Your health care provider may also give you more specific instructions. If you have problems or questions, contact your health careprovider. What can I expect after the procedure? After the procedure, it is common to have: Pain. Soreness and bruising around your puncture or incision (access site). Fatigue. Follow these instructions at home: Access site care  Follow instructions from your health care provider about how to take care of your access site. Make sure you: Wash your hands with soap and water for at least 20 seconds before and after you change your bandage (dressing). If soap and water are not available, use hand sanitizer. Change your dressing as told by your health care provider. Leave stitches (sutures), skin glue, or adhesive strips in place. If adhesive strip edges start to loosen and curl up, you may trim the loose edges. Do not remove adhesive strips or skin glue completely unless your health care provider tells you to do that. Check your access site every day for signs of infection. Check for: More redness, swelling, or pain. A lump or bump. Fluid or blood. Warmth. Pus or a bad smell.  Medicines Take over-the-counter and prescription medicines only as told by your health care provider. You may need to take medicines to prevent blood clots and to lower your cholesterol. If you were prescribed an antibiotic medicine, take it as told by your health care provider. Do not stop taking the antibiotic even if you start to feel better. Driving Do not drive until your health care provider approves. If you were given a sedative during the procedure, it can affect you for several hours. Do not drive or operate machinery until your health care provider says that it is safe. Ask your health care provider if the medicine prescribed  to you requires you to avoid driving or using machinery. Activity Rest as told by your health care provider. Avoid sitting for a long time without moving. Get up to take short walks every 1-2 hours. This is important to improve blood flow and breathing. Ask for help if you feel weak or unsteady. Do not lift anything that is heavier than 10 lb (4.5 kg), or the limit that you are told, until your health care provider says that it is safe. Avoid activity that requires a lot of effort, such as exercise and sports, as told by your health care provider. Avoid sexual activity until your health care provider says it is safe. Follow your exercise plan as told by your health care provider. Return to your normal activities as told by your health care provider. Ask your health care provider what activities are safe for you. Eating and drinking Drink fluids as instructed to help flush out the dye used during the procedure. Follow instructions from your health care provider about eating or drinking restrictions. You may need to eat a diet that is low in salt (sodium) and fat. Avoid drinking alcohol. General instructions Do not take baths, swim, or use a hot tub until your health care provider approves. Ask your health care provider if you may take showers. You may only be allowed to take sponge baths. Do not use any products that contain nicotine or tobacco. These products include cigarettes, chewing tobacco, and vaping devices, such as e-cigarettes. If you need help quitting, ask your health care provider. Keep all follow-up visits. This is important. Contact a health care   provider if: You have a fever. You have severe pain that does not get better with medicine. You have more redness, swelling, or pain around your access site. You have a lump or bump at your access site. Get help right away if:  You have fluid or blood coming from your access site. If this happens, lie down on your back and apply  pressure to the area. You have chest pain. You have problems breathing. You have pain, numbness, or tingling in your legs. You faint. You have any symptoms of a stroke. "BE FAST" is an easy way to remember the main warning signs of a stroke: B - Balance. Signs are dizziness, sudden trouble walking, or loss of balance. E - Eyes. Signs are trouble seeing or a sudden change in vision. F - Face. Signs are sudden weakness or numbness of the face, or the face or eyelid drooping on one side. A - Arms. Signs are weakness or numbness in an arm. This happens suddenly and usually on one side of the body. S - Speech. Signs are sudden trouble speaking, slurred speech, or trouble understanding what people say. T - Time. Time to call emergency services. Write down what time symptoms started. You have other signs of a stroke, such as: A sudden, severe headache with no known cause. Nausea or vomiting. Seizure. These symptoms may represent a serious problem that is an emergency. Do not wait to see if the symptoms will go away. Get medical help right away. Call your local emergency services (911 in the [place] Do not drive yourself to the hospital. Summary After the procedure, it is common to have pain and soreness near your puncture or incision (access site). Check your access site every day for signs of infection, such as redness, swelling, or pain. You may need to take medicines to prevent blood clots and to lower your cholesterol. If you have any signs of a stroke, get help right away. This information is not intended to replace advice given to you by your health care provider. Make sure you discuss any questions you have with your healthcare provider. Document Revised: 03/12/2020 Document Reviewed: 03/12/2020 Elsevier Patient Education  2022 [employer]   

## 2021-04-09 NOTE — Assessment & Plan Note (Signed)
blood glucose control important in reducing the progression of atherosclerotic disease. Also, involved in wound healing. On appropriate medications.  

## 2021-04-09 NOTE — Assessment & Plan Note (Signed)
blood pressure control important in reducing the progression of atherosclerotic disease. On appropriate oral medications.  

## 2021-04-09 NOTE — Assessment & Plan Note (Signed)
lipid control important in reducing the progression of atherosclerotic disease. Continue statin therapy  

## 2021-04-15 ENCOUNTER — Encounter
Admission: RE | Disposition: A | Discharge status: Home / Self Care | Payer: Self-pay | Source: Ambulatory Visit | Attending: Vascular Surgery

## 2021-04-15 ENCOUNTER — Ambulatory Visit
Admission: RE | Admit: 2021-04-15 | Discharge: 2021-04-15 | Disposition: A | Discharge status: Home / Self Care | Payer: Medicaid Other | Source: Ambulatory Visit | Attending: Vascular Surgery | Admitting: Vascular Surgery

## 2021-04-15 ENCOUNTER — Other Ambulatory Visit (INDEPENDENT_AMBULATORY_CARE_PROVIDER_SITE_OTHER): Payer: Self-pay | Admitting: Nurse Practitioner

## 2021-04-15 ENCOUNTER — Encounter: Payer: Self-pay | Admitting: Vascular Surgery

## 2021-04-15 DIAGNOSIS — F17210 Nicotine dependence, cigarettes, uncomplicated: Secondary | ICD-10-CM | POA: Insufficient documentation

## 2021-04-15 DIAGNOSIS — I714 Abdominal aortic aneurysm, without rupture, unspecified: Secondary | ICD-10-CM

## 2021-04-15 DIAGNOSIS — Z7982 Long term (current) use of aspirin: Secondary | ICD-10-CM | POA: Insufficient documentation

## 2021-04-15 DIAGNOSIS — I739 Peripheral vascular disease, unspecified: Secondary | ICD-10-CM

## 2021-04-15 DIAGNOSIS — E785 Hyperlipidemia, unspecified: Secondary | ICD-10-CM | POA: Insufficient documentation

## 2021-04-15 DIAGNOSIS — I10 Essential (primary) hypertension: Secondary | ICD-10-CM | POA: Insufficient documentation

## 2021-04-15 DIAGNOSIS — I70221 Atherosclerosis of native arteries of extremities with rest pain, right leg: Secondary | ICD-10-CM

## 2021-04-15 DIAGNOSIS — I708 Atherosclerosis of other arteries: Secondary | ICD-10-CM | POA: Insufficient documentation

## 2021-04-15 DIAGNOSIS — Z79899 Other long term (current) drug therapy: Secondary | ICD-10-CM | POA: Insufficient documentation

## 2021-04-15 DIAGNOSIS — Z885 Allergy status to narcotic agent status: Secondary | ICD-10-CM | POA: Insufficient documentation

## 2021-04-15 DIAGNOSIS — E1151 Type 2 diabetes mellitus with diabetic peripheral angiopathy without gangrene: Secondary | ICD-10-CM | POA: Insufficient documentation

## 2021-04-15 DIAGNOSIS — Z888 Allergy status to other drugs, medicaments and biological substances status: Secondary | ICD-10-CM | POA: Insufficient documentation

## 2021-04-15 DIAGNOSIS — Z794 Long term (current) use of insulin: Secondary | ICD-10-CM | POA: Insufficient documentation

## 2021-04-15 DIAGNOSIS — Z7984 Long term (current) use of oral hypoglycemic drugs: Secondary | ICD-10-CM | POA: Insufficient documentation

## 2021-04-15 DIAGNOSIS — Z7902 Long term (current) use of antithrombotics/antiplatelets: Secondary | ICD-10-CM | POA: Insufficient documentation

## 2021-04-15 HISTORY — DX: Abdominal aortic aneurysm, without rupture, unspecified: I71.40

## 2021-04-15 HISTORY — DX: Abdominal aortic aneurysm, without rupture: I71.4

## 2021-04-15 HISTORY — DX: Presence of coronary angioplasty implant and graft: Z95.5

## 2021-04-15 HISTORY — PX: LOWER EXTREMITY ANGIOGRAPHY: CATH118251

## 2021-04-15 LAB — GLUCOSE, CAPILLARY: Glucose-Capillary: 169 mg/dL — ABNORMAL HIGH (ref 70–99)

## 2021-04-15 LAB — BUN: BUN: 10 mg/dL (ref 6–20)

## 2021-04-15 LAB — CREATININE, SERUM
Creatinine, Ser: 0.56 mg/dL (ref 0.44–1.00)
GFR, Estimated: >60 mL/min (ref >60)

## 2021-04-15 SURGERY — LOWER EXTREMITY ANGIOGRAPHY
Anesthesia: Moderate Sedation | Site: Leg Upper | Laterality: Right

## 2021-04-15 MED ORDER — MIDAZOLAM HCL 5 MG/5ML IJ SOLN
INTRAMUSCULAR | Status: AC
  Filled 2021-04-15: qty 5

## 2021-04-15 MED ORDER — MIDAZOLAM HCL 2 MG/2ML IJ SOLN
INTRAMUSCULAR | Status: DC | PRN
  Administered 2021-04-15: 1 mg via INTRAVENOUS
  Administered 2021-04-15: 2 mg via INTRAVENOUS

## 2021-04-15 MED ORDER — FENTANYL CITRATE (PF) 100 MCG/2ML IJ SOLN
INTRAMUSCULAR | Status: DC | PRN
  Administered 2021-04-15 (×2): 50 ug via INTRAVENOUS

## 2021-04-15 MED ORDER — METHYLPREDNISOLONE SODIUM SUCC 125 MG IJ SOLR: 125.0000 mg | Freq: Once | INTRAMUSCULAR | Status: DC | PRN

## 2021-04-15 MED ORDER — CEFAZOLIN SODIUM-DEXTROSE 2-4 GM/100ML-% IV SOLN
INTRAVENOUS | Status: AC
  Administered 2021-04-15: 2 g via INTRAVENOUS
  Filled 2021-04-15: qty 100

## 2021-04-15 MED ORDER — SODIUM CHLORIDE 0.9 % IV SOLN
INTRAVENOUS | Status: DC
  Administered 2021-04-15: 1000 mL via INTRAVENOUS

## 2021-04-15 MED ORDER — FENTANYL CITRATE (PF) 100 MCG/2ML IJ SOLN: 12.5000 ug | Freq: Once | INTRAMUSCULAR | Status: DC | PRN

## 2021-04-15 MED ORDER — FAMOTIDINE 20 MG PO TABS: 40.0000 mg | ORAL_TABLET | Freq: Once | ORAL | Status: DC | PRN

## 2021-04-15 MED ORDER — CEFAZOLIN SODIUM-DEXTROSE 2-4 GM/100ML-% IV SOLN: 2.0000 g | Freq: Once | INTRAVENOUS | Status: AC

## 2021-04-15 MED ORDER — DIPHENHYDRAMINE HCL 50 MG/ML IJ SOLN: 50.0000 mg | Freq: Once | INTRAMUSCULAR | Status: DC | PRN

## 2021-04-15 MED ORDER — FENTANYL CITRATE (PF) 100 MCG/2ML IJ SOLN
INTRAMUSCULAR | Status: AC
  Filled 2021-04-15: qty 2

## 2021-04-15 MED ORDER — ONDANSETRON HCL 4 MG/2ML IJ SOLN: 4.0000 mg | Freq: Four times a day (QID) | INTRAMUSCULAR | Status: DC | PRN

## 2021-04-15 MED ORDER — HEPARIN SODIUM (PORCINE) 1000 UNIT/ML IJ SOLN
INTRAMUSCULAR | Status: AC
  Filled 2021-04-15: qty 1

## 2021-04-15 MED ORDER — MIDAZOLAM HCL 2 MG/ML PO SYRP: 8.0000 mg | ORAL_SOLUTION | Freq: Once | ORAL | Status: DC | PRN

## 2021-04-15 MED ORDER — IODIXANOL 320 MG/ML IV SOLN
INTRAVENOUS | Status: DC | PRN
  Administered 2021-04-15: 35 mL via INTRA_ARTERIAL

## 2021-04-15 SURGICAL SUPPLY — 10 items
CATH ANGIO 5F PIGTAIL 65CM (CATHETERS) ×2 IMPLANT
CATH TEMPO 5F RIM 65CM (CATHETERS) ×2 IMPLANT
COVER PROBE U/S 5X48 (MISCELLANEOUS) ×2 IMPLANT
DEVICE STARCLOSE SE CLOSURE (Vascular Products) ×2 IMPLANT
GLIDEWIRE ADV .035X260CM (WIRE) ×2 IMPLANT
PACK ANGIOGRAPHY (CUSTOM PROCEDURE TRAY) ×2 IMPLANT
SHEATH BRITE TIP 5FRX11 (SHEATH) ×2 IMPLANT
SYR MEDRAD MARK 7 150ML (SYRINGE) ×2 IMPLANT
TUBING CONTRAST HIGH PRESS 72 (TUBING) ×2 IMPLANT
WIRE GUIDERIGHT .035X150 (WIRE) ×2 IMPLANT

## 2021-04-15 NOTE — H&P (View-Only) (Signed)
[facility] History & Physical Update  The patient was interviewed and re-examined.  The patient's previous History and Physical has been reviewed and is unchanged.  There is no change in the plan of care. We plan to proceed with the scheduled procedure.  [name], MD  04/15/2021, 4:28 PM

## 2021-04-15 NOTE — H&P (Signed)
[facility] History & Physical Update  The patient was interviewed and re-examined.  The patient's previous History and Physical has been reviewed and is unchanged.  There is no change in the plan of care. We plan to proceed with the scheduled procedure.  Lacy Taglieri, MD  04/15/2021, 4:28 PM    

## 2021-04-15 NOTE — Interval H&P Note (Signed)
History and Physical Interval Note:  04/15/2021 4:28 PM  Emily Krause  has presented today for surgery, with the diagnosis of RT Lower Extremity Angiography   PAD with claudication   BARD Rep cc: Loni Muse.  The various methods of treatment have been discussed with the patient and family. After consideration of risks, benefits and other options for treatment, the patient has consented to  Procedure(s): LOWER EXTREMITY ANGIOGRAPHY (Right) as a surgical intervention.  The patient's history has been reviewed, patient examined, no change in status, stable for surgery.  I have reviewed the patient's chart and labs.  Questions were answered to the patient's satisfaction.     [name]

## 2021-04-15 NOTE — Op Note (Signed)
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  Ultrasound guidance for vascular access left femoral artery             2.  Catheter placement into right SFA from left femoral approach             3.  Aortogram and selective right lower extremity angiogram             4.  StarClose closure device left femoral artery  EBL: 3 cc  Contrast: 35 cc  Fluoro Time: 1.4 minutes  Moderate Conscious Sedation Time: approximately 20 minutes using 3 mg of Versed and 100 mcg of Fentanyl              Indications:  Patient is a 60 y.o.female with right lower extremity claudication symptoms. The patient has noninvasive study showing incidental significant lower extremity plaque on a DVT study. The patient is brought in for angiography for further evaluation and potential treatment.  Risks and benefits are discussed and informed consent is obtained.   Procedure:  The patient was identified and appropriate procedural time out was performed.  The patient was then placed supine on the table and prepped and draped in the usual sterile fashion. Moderate conscious sedation was administered during a face to face encounter with the patient throughout the procedure with my supervision of the RN administering medicines and monitoring the patient's vital signs, pulse oximetry, telemetry and mental status throughout from the start of the procedure until the patient was taken to the recovery room. Ultrasound was used to evaluate the left common femoral artery.  It was patent .  A digital ultrasound image was acquired.  A Seldinger needle was used to access the left common femoral  artery under direct ultrasound guidance and a permanent image was performed.  A 0.035 J wire was advanced without resistance and a 5Fr sheath was placed.  Pigtail catheter was placed into the aorta and an AP aortogram was performed. This demonstrated that the right renal artery was more superior and may have had some degree of disease although it was not well seen.  Left renal artery was widely patent.  2 to 3 cm below the renal arteries, there began an infrarenal abdominal aortic aneurysm.  Measurements on angiography were approximately 4.9 cm in maximal diameter in the distal aorta.  There is an associated occlusive disease in the common iliac arteries bilaterally worse on the right than the left with a degree of stenosis difficult to discern because of the aneurysmal degeneration.  The external iliac arteries appeared relatively normal bilaterally. I then crossed the aortic bifurcation with a rim catheter and advantage wire and advanced to the right femoral head and then into the proximal to mid right SFA to help opacify distally. Selective right lower extremity angiogram was then performed. This demonstrated relatively normal common femoral artery, profunda femoris artery, superficial femoral and popliteal artery without significant stenosis.  There was two-vessel runoff distally although it was somewhat sluggish.  At this point, no intervention will be performed today.  The patient will likely require repair of her abdominal aortic aneurysm both for the significant size as well as treatment of  the associated iliac artery occlusive disease for her symptoms at a later date with general anesthesia.  I elected to terminate the procedure. The sheath was removed and StarClose closure device was deployed in the left femoral artery with excellent hemostatic result. The patient was taken to the recovery room in stable condition having tolerated the procedure well.  Findings:               Aortogram: The right renal  artery was more superior and may have had some degree of disease although it was not well seen.  Left renal artery was widely patent.  2 to 3 cm below the renal arteries, there began an infrarenal abdominal aortic aneurysm.  Measurements on angiography were approximately 4.9 cm in maximal diameter in the distal aorta.  There is an associated occlusive disease in the common iliac arteries bilaterally worse on the right than the left with a degree of stenosis difficult to discern because of the aneurysmal degeneration.  The external iliac arteries appeared relatively normal bilaterally.             Right lower Extremity: Relatively normal common femoral artery, profunda femoris artery, superficial femoral and popliteal artery without significant stenosis.  There was two-vessel runoff distally although it was somewhat sluggish.   Disposition: Patient was taken to the recovery room in stable condition having tolerated the procedure well.  Complications: None  [name] 04/15/2021 10:00 AM   This note was created with Dragon Medical transcription system. Any errors in dictation are purely unintentional.

## 2021-04-17 ENCOUNTER — Telehealth (INDEPENDENT_AMBULATORY_CARE_PROVIDER_SITE_OTHER): Payer: Self-pay

## 2021-04-17 NOTE — Telephone Encounter (Signed)
Spoke with the patient and Emily Krause is scheduled with Dr. Wyn Quaker for a Endovascular AAA Stent Graft repair on 05/01/21 with a 10:00 am arrival to the [facility]. Pre-op phone call is on 04/24/21 between 8-1 pm and covid testing on 04/29/21 between 8-12 pm at the [facility]. Pre-surgical instructions were discussed and will be mailed.

## 2021-04-24 ENCOUNTER — Other Ambulatory Visit
Admission: RE | Admit: 2021-04-24 | Discharge: 2021-04-24 | Disposition: A | Discharge status: Home / Self Care | Payer: Medicaid Other | Source: Ambulatory Visit | Attending: Vascular Surgery | Admitting: Vascular Surgery

## 2021-04-24 ENCOUNTER — Other Ambulatory Visit (INDEPENDENT_AMBULATORY_CARE_PROVIDER_SITE_OTHER): Payer: Self-pay | Admitting: Nurse Practitioner

## 2021-04-24 ENCOUNTER — Other Ambulatory Visit: Payer: Self-pay

## 2021-04-24 HISTORY — DX: Pneumonia, unspecified organism: J18.9

## 2021-04-24 HISTORY — DX: Atherosclerotic heart disease of native coronary artery without angina pectoris: I25.10

## 2021-04-24 HISTORY — DX: Gastro-esophageal reflux disease without esophagitis: K21.9

## 2021-04-24 HISTORY — DX: Depression, unspecified: F32.A

## 2021-04-24 HISTORY — DX: Anxiety disorder, unspecified: F41.9

## 2021-04-24 HISTORY — DX: Unspecified osteoarthritis, unspecified site: M19.90

## 2021-04-24 NOTE — Patient Instructions (Addendum)
Your procedure is scheduled on: 05/01/21 Wednesday Report to the Registration Desk on the 1st floor of the [facility]. To find out your arrival time, please call [phone] between 1PM - 3PM on: 04/30/21 Tuesday Report to [facility] on 04/29/21 at 8:00 am for Labs, EKG and Covid Test.  REMEMBER: Instructions that are not followed completely may result in serious medical risk, up to and including death; or upon the discretion of your surgeon and anesthesiologist your surgery may need to be rescheduled.  Do not eat food or drink any fluids after midnight the night before surgery.  No gum chewing, lozengers or hard candies.  TAKE THESE MEDICATIONS THE MORNING OF SURGERY WITH A SIP OF WATER:  - carvedilol (COREG) 3.125 MG tablet - citalopram (CELEXA) 10 MG tablet - omeprazole (PRILOSEC) 20 MG capsule, take one the night before and one on the morning of surgery - helps to prevent nausea after surgery.  Stop Janumet 2 days prior to surgery. Do not take 08/08, 08/09 and do not take the day of surgery.  Follow recommendations from Cardiologist, Pulmonologist or PCP regarding stopping Aspirin, Coumadin, Plavix, Eliquis, Pradaxa, or Pletal.  One week prior to surgery: Stop Anti-inflammatories (NSAIDS) such as Advil, Aleve, Ibuprofen, Motrin, Naproxen, Naprosyn and Aspirin based products such as Excedrin, Goodys Powder, BC Powder.  Stop ANY OVER THE COUNTER supplements until after surgery.  You may however, continue to take Tylenol if needed for pain up until the day of surgery.  No Alcohol for 24 hours before or after surgery.  No Smoking including e-cigarettes for 24 hours prior to surgery.  No chewable tobacco products for at least 6 hours prior to surgery.  No nicotine patches on the day of surgery.  Do not use any "recreational" drugs for at least a week prior to your surgery.  Please be advised that the combination of cocaine and anesthesia may have negative outcomes, up to and  including death. If you test positive for cocaine, your surgery will be cancelled.  On the morning of surgery brush your teeth with toothpaste and water, you may rinse your mouth with mouthwash if you wish. Do not swallow any toothpaste or mouthwash.  Do not wear jewelry, make-up, hairpins, clips or nail polish.  Do not wear lotions, powders, or perfumes.   Do not shave body from the neck down 48 hours prior to surgery just in case you cut yourself which could leave a site for infection.  Also, freshly shaved skin may become irritated if using the CHG soap.  Contact lenses, hearing aids and dentures may not be worn into surgery.  Do not bring valuables to the hospital. [facility] is not responsible for any missing/lost belongings or valuables.   Use CHG Soap or wipes as directed on instruction sheet.  Notify your doctor if there is any change in your medical condition (cold, fever, infection).  Wear comfortable clothing (specific to your surgery type) to the hospital.  After surgery, you can help prevent lung complications by doing breathing exercises.  Take deep breaths and cough every 1-2 hours. Your doctor may order a device called an Incentive Spirometer to help you take deep breaths. When coughing or sneezing, hold a pillow firmly against your incision with both hands. This is called "splinting." Doing this helps protect your incision. It also decreases belly discomfort.  If you are being admitted to the hospital overnight, leave your suitcase in the car. After surgery it may be brought to your room.  If you are being discharged the day of surgery, you will not be allowed to drive home. You will need a responsible adult (18 years or older) to drive you home and stay with you that night.   If you are taking public transportation, you will need to have a responsible adult (18 years or older) with you. Please confirm with your physician that it is acceptable to use public  transportation.   Please call the Pre-admissions Testing Dept. at [phone] if you have any questions about these instructions.  Surgery Visitation Policy:  Patients undergoing a surgery or procedure may have one family member or support person with them as long as that person is not COVID-19 positive or experiencing its symptoms.  That person may remain in the waiting area during the procedure.  Inpatient Visitation:    Visiting hours are 7 a.m. to 8 p.m. Inpatients will be allowed two visitors daily. The visitors may change each day during the patient's stay. No visitors under the age of 61. Any visitor under the age of 69 must be accompanied by an adult. The visitor must pass COVID-19 screenings, use hand sanitizer when entering and exiting the patient's room and wear a mask at all times, including in the patient's room. Patients must also wear a mask when staff or their visitor are in the room. Masking is required regardless of vaccination status.

## 2021-04-25 ENCOUNTER — Telehealth (INDEPENDENT_AMBULATORY_CARE_PROVIDER_SITE_OTHER): Payer: Self-pay

## 2021-04-25 NOTE — Telephone Encounter (Signed)
I called the pt and made her aware of a change in her procedure date to 05/02/21 at 6:45 AM. The pt voiced understanding.

## 2021-04-26 ENCOUNTER — Encounter: Payer: Self-pay | Admitting: Vascular Surgery

## 2021-04-26 NOTE — Progress Notes (Signed)
Perioperative Services  Pre-Admission/Anesthesia Testing Clinical Review  Date: 04/29/21  Patient Demographics:  Name: Emily Krause DOB:   [birth year] MRN:   [id number]  Planned Surgical Procedure(s):    Case: [id number] Date/Time: 05/02/21 0800   Procedure: ENDOVASCULAR REPAIR/STENT GRAFT   Anesthesia type: General   Diagnosis: AAA (abdominal aortic aneurysm) without rupture (HCC) [I71.4]   Pre-op diagnosis:      Endovascular stent graft repair  GORE  Anesthesia    AAA     Dr Wyn Quaker w Dr Gilda Crease to assist   Location: [facility]   Providers: Annice Needy, MD   NOTE: Available PAT nursing documentation and vital signs have been reviewed. Clinical nursing staff has updated patient's PMH/PSHx, current medication list, and drug allergies/intolerances to ensure comprehensive history available to assist in medical decision making as it pertains to the aforementioned surgical procedure and anticipated anesthetic course. Extensive review of available clinical information performed. Trail PMH and PSHx updated with any diagnoses/procedures that  may have been inadvertently omitted during her intake with the pre-admission testing department's nursing staff.  Clinical Discussion:  Emily Krause is a 60 y.o. female who is submitted for pre-surgical anesthesia review and clearance prior to her undergoing the above procedure. Patient is a Current Smoker (22 pack years). Pertinent PMH includes: CAD, AAA, PAD, iliac artery stenosis, G1DD, HTN, HLD, T2DM, GERD (on daily PPI), OA, anxiety, depression.  Patient is followed by cardiology Gwen Pounds, MD). She was last seen in the cardiology clinic on 03/20/2021; notes reviewed.  At the time of her clinic visit, patient doing well overall from a cardiovascular perspective.  She denied any episodes of chest pain, shortness of breath, PND, orthopnea, palpitations, significant peripheral edema, vertiginous symptoms, or presyncope/syncope.   Patient does report intermittent claudication associated with ambulation in the setting of known peripheral artery disease.  PMH significant for cardiovascular diagnoses.  Patient suffered an NSTEMI on 12/04/2016.  Diagnostic left heart catheterization was performed revealing a high-grade mid LCx lesion and an unusual mid RCA lesion suspicious for possible spontaneous dissection and ulceration.  LVEF was normal with an EF >55%.  LVEDP normal.  PCI was performed to the 90% mid LCx lesion using a 3 x 12 mm EluNIR DES x 1 with excellent angiographic result and restoration of TIMI-3 flow.  TTE performed on 02/27/2021 revealed normal left ventricular systolic function with an EF of 60 to 65%.  Doppler parameters consistent with grade 1 diastolic dysfunction.  There was mild mitral valve regurgitation noted.  Myocardial perfusion imaging study performed on 02/27/2021 revealed a revealed a normal left ventricular systolic function with an EF of 55 to 65%.  There was no evidence of stress-induced myocardial ischemia or arrhythmia.  Study determined to be low risk.  Patient underwent lower extremity angiography study revealing significant AAA and associated occlusive iliac artery disease.  Aneurysm measured at 4.9 cm in maximum diameter in the distal aorta.  Patient underwent stent placement to the RIGHT SFA (see full interpretation of cardiovascular testing below).  Patient on daily DAPT therapy (ASA + clopidogrel); compliant with therapy with no evidence of GI bleeding.  Blood pressure well controlled at 128/82 on currently prescribed beta-blocker, ARB, and diuretic therapies.  Patient is on a statin for HLD.  T2DM well controlled on currently prescribed regimen; last Hgb A1c 6.7% when checked on 04/29/2021.  Functional capacity limited by claudication pain, however patient still felt to be able to achieve at least 4 METS of  activity.  No changes were made to her medication regimen.  Patient follow-up with  cardiology in 6 months or sooner if needed.  Patient is scheduled for an EVAR on 05/02/2021 with Dr. Festus BarrenJason Dew, MD.  Given patient's past medical history significant for cardiovascular diagnoses, presurgical cardiac clearance was sought by the PAT team. Per cardiology, "the patient is at the lowest risk possible for perioperative cardiovascular complications with the planned procedure.  The overall risk her procedure is low (<1%).  Currently has no evidence active and/or significant angina and/or congestive heart failure. Patient may proceed to surgery without restriction or need for further cardiovascular testing and an overall LOW risk".  Again, this patient is on daily DAPT therapy.  Cardiology has cleared patient to hold her DAPT medications for 4 days if deemed necessary prior to her procedure with plans to restart as soon as postoperative bleeding risk felt to be minimized by primary attending surgeon.  Of note, prior Dr. Driscilla Grammesew's standing orders for EVAR, patient to continue both clopidogrel and ASA throughout the perioperative period.  Patient denies previous perioperative complications with anesthesia in the past. In review of the EMR, there are no available records for review regarding patient's previous surgical/anesthetic courses within [facility]. Of note, patient had no complications with recent angiograph study performed under moderate sedation on 04/15/2021.  Vitals with BMI 04/15/2021 04/15/2021 04/15/2021  Height - - -  Weight - - -  BMI - - -  Systolic 149 156 [id number]  Diastolic 80 84 84  Pulse 104 98 100    Providers/Specialists:   NOTE: Primary physician provider listed below. Patient may have been seen by APP or partner within same practice.   PROVIDER ROLE / SPECIALTY LAST Shanna CiscoV  Dew, Jason S, MD Vascular Surgery 04/15/2021  Sandrea Hughsubio, Jessica, NP Primary Care Provider ???  Arnoldo HookerKowalski, Bruce, MD Cardiology 03/20/2021   Allergies:  Codeine, Lisinopril, Quinapril, and  Metoprolol  Current Home Medications:   No current facility-administered medications for this encounter.    amLODipine (NORVASC) 10 MG tablet   aspirin 81 MG chewable tablet   atorvastatin (LIPITOR) 20 MG tablet   atorvastatin (LIPITOR) 80 MG tablet   buPROPion (WELLBUTRIN XL) 150 MG 24 hr tablet   carvedilol (COREG) 3.125 MG tablet   citalopram (CELEXA) 10 MG tablet   clopidogrel (PLAVIX) 75 MG tablet   cyclobenzaprine (FLEXERIL) 10 MG tablet   glipiZIDE (GLUCOTROL) 5 MG tablet   insulin detemir (LEVEMIR) 100 UNIT/ML injection   linagliptin (TRADJENTA) 5 MG TABS tablet   losartan-hydrochlorothiazide (HYZAAR) 100-25 MG per tablet   metFORMIN (GLUCOPHAGE) 500 MG tablet   NITROSTAT 0.4 MG SL tablet   omeprazole (PRILOSEC) 20 MG capsule   oxyCODONE-acetaminophen (PERCOCET/ROXICET) 5-325 MG tablet   PARoxetine (PAXIL) 40 MG tablet   sitaGLIPtin-metformin (JANUMET) 50-1000 MG tablet   traMADol (ULTRAM) 50 MG tablet   UNABLE TO FIND   History:   Past Medical History:  Diagnosis Date   AAA (abdominal aortic aneurysm) (HCC) 04/15/2021   a.) 4.9 cm infrarenal aneurysm noted on angiography study   Anxiety    Arthritis    Chronic anticoagulation    DAPT (ASA + clopidogrel)   Coronary artery disease    Depression    GERD (gastroesophageal reflux disease)    Grade I diastolic dysfunction    HLD (hyperlipidemia)    Hypertension    Iliac artery stenosis, right (HCC)    s/p SFA stenting on 04/15/2021   Migraine  NSTEMI (non-ST elevated myocardial infarction) (HCC) 12/04/2016   PCI with DES x 1 to mLCx   PAD (peripheral artery disease) (HCC)    Pneumonia    Stented coronary artery 12/04/2016   a.) 3x81mm EluNIR DES x 1 to mLCx   T2DM (type 2 diabetes mellitus) (HCC)    Past Surgical History:  Procedure Laterality Date   CESAREAN SECTION     COLONOSCOPY     CORONARY ANGIOPLASTY WITH STENT PLACEMENT N/A 12/04/2016   90% mLCx --> PCI with placement of 3 x 12 mm EluNIR DES x  1; Location: [facility]; Surgeon: Hoy Finlay, MD   LOWER EXTREMITY ANGIOGRAPHY Right 04/15/2021   Procedure: LOWER EXTREMITY ANGIOGRAPHY;  Surgeon: Annice Needy, MD;  Location: [facility] INVASIVE CV LAB;  Service: Cardiovascular;  Laterality: Right;   THROAT SURGERY     lymph node   Family History  Problem Relation Age of Onset   Uterine cancer Mother    Diabetes Father    Social History   Tobacco Use   Smoking status: Every Day    Packs/day: 0.50    Years: 44.00    Pack years: 22.00    Types: Cigarettes   Smokeless tobacco: Never  Vaping Use   Vaping Use: Never used  Substance Use Topics   Alcohol use: No   Drug use: Never    Pertinent Clinical Results:  LABS: Labs reviewed: Acceptable for surgery.  Hospital Outpatient Visit on 04/29/2021  Component Date Value Ref Range Status   Hgb A1c MFr Bld 04/29/2021 6.7 (A) 4.8 - 5.6 % Final   Comment: (NOTE) Pre diabetes:          5.7%-6.4%  Diabetes:              >6.4%  Glycemic control for   <7.0% adults with diabetes    Mean Plasma Glucose 04/29/2021 145.59  mg/dL Final   Performed at [facility], [address] [place], [place] [address]   WBC 04/29/2021 11.2 (A) 4.0 - 10.5 K/uL Final   RBC 04/29/2021 4.85  3.87 - 5.11 MIL/uL Final   Hemoglobin 04/29/2021 10.4 (A) 12.0 - 15.0 g/dL Final   HCT 30/16/0109 34.0 (A) 36.0 - 46.0 % Final   MCV 04/29/2021 70.1 (A) 80.0 - 100.0 fL Final   MCH 04/29/2021 21.4 (A) 26.0 - 34.0 pg Final   MCHC 04/29/2021 30.6  30.0 - 36.0 g/dL Final   RDW 32/35/5732 21.4 (A) 11.5 - 15.5 % Final   Platelets 04/29/2021 435 (A) 150 - 400 K/uL Final   nRBC 04/29/2021 0.0  0.0 - 0.2 % Final   Neutrophils Relative % 04/29/2021 69  % Final   Neutro Abs 04/29/2021 7.7  1.7 - 7.7 K/uL Final   Lymphocytes Relative 04/29/2021 24  % Final   Lymphs Abs 04/29/2021 2.7  0.7 - 4.0 K/uL Final   Monocytes Relative 04/29/2021 5  % Final   Monocytes Absolute 04/29/2021 0.5  0.1 - 1.0 K/uL Final   Eosinophils  Relative 04/29/2021 1  % Final   Eosinophils Absolute 04/29/2021 0.1  0.0 - 0.5 K/uL Final   Basophils Relative 04/29/2021 1  % Final   Basophils Absolute 04/29/2021 0.1  0.0 - 0.1 K/uL Final   WBC Morphology 04/29/2021 MORPHOLOGY UNREMARKABLE   Final   RBC Morphology 04/29/2021 MIXED RBC MORPHOLOGY PRESENT   Final   Smear Review 04/29/2021 Normal platelet morphology   Final   Immature Granulocytes 04/29/2021 0  % Final  Abs Immature Granulocytes 04/29/2021 0.05  0.00 - 0.07 K/uL Final   Polychromasia 04/29/2021 PRESENT   Final   Performed at [facility], [address] [place], [place] [address]   Sodium 04/29/2021 131 (A) 135 - 145 mmol/L Final   Potassium 04/29/2021 4.3  3.5 - 5.1 mmol/L Final   Chloride 04/29/2021 97 (A) 98 - 111 mmol/L Final   CO2 04/29/2021 25  22 - 32 mmol/L Final   Glucose, Bld 04/29/2021 165 (A) 70 - 99 mg/dL Final   Glucose reference range applies only to samples taken after fasting for at least 8 hours.   BUN 04/29/2021 9  6 - 20 mg/dL Final   Creatinine, Ser 04/29/2021 0.50  0.44 - 1.00 mg/dL Final   Calcium 60/45/4098 9.1  8.9 - 10.3 mg/dL Final   GFR, Estimated 04/29/2021 >60  >60 mL/min Final   Comment: (NOTE) Calculated using the CKD-EPI Creatinine Equation (2021)    Anion gap 04/29/2021 9  5 - 15 Final   Performed at [facility], [address] [place], [place] [address]   ABO/RH(D) 04/29/2021 O POS   Final   Antibody Screen 04/29/2021 NEG   Final   Sample Expiration 04/29/2021 05/13/2021,2359   Final   Extend sample reason 04/29/2021    Final                   Value:NO TRANSFUSIONS OR PREGNANCY IN THE PAST 3 MONTHS Performed at [facility], [address] [place], [place] [address]     ECG: Date: 04/29/2021 Time ECG obtained: 0928 AM Rate: 107 bpm Rhythm: sinus tachycardia Axis (leads I and aVF): Normal Intervals: PR 142 ms. QRS 76 ms. QTc 445 ms. ST segment and T wave changes: No evidence of acute ST  segment elevation or depression Comparison: Similar to previous tracing obtained on 04/11/2015   IMAGING / PROCEDURES: LEXISCAN performed on 02/27/2021 Normal left ventricular systolic function with an LVEF of 55-65%. No evidence of stress induced myocardial ischemia or arrhythmias Blood pressure demonstrated a normal response to stress Normal low risk study  TRANSESOPHAGEAL ECHOCARDIOGRAM performed on 02/27/2021 Left ventricular ejection fraction, by estimation, is 60 to 65%. The left ventricle has normal function. The left ventricle has no regional  wall motion abnormalities. There is mild left ventricular hypertrophy. Left ventricular diastolic parameters are consistent with Grade I diastolic dysfunction (impaired relaxation).  Right ventricular systolic function is normal. The right ventricular size is normal.  The mitral valve is normal in structure. Mild mitral valve regurgitation. No evidence of mitral stenosis.  The aortic valve is normal in structure. Aortic valve regurgitation is mild. No aortic stenosis is present.  The inferior vena cava is normal in size with greater than 50% respiratory variability, suggesting right atrial pressure of 3 mmHg.   LEFT HEART CATHETERIZATION AND CORONARY ANGIOGRAPHY performed on 12/04/2016 Coronary artery disease (as described below) including a 90% mid LCx stenosis and non-flow limiting ulcerated mid RCA lesion per coronary  angiography performed today.  Successful PCI to the mid LCx 90% stenosis with the placement of a 3 x 12 mm EluNIR drug eluting stent post-dilated with a 3.25 mm [place] balloon with excellent angiographic result and TIMI 3 flow.   Impression and Plan:  Emily Krause has been referred for pre-anesthesia review and clearance prior to her undergoing the planned anesthetic and procedural courses. Available labs, pertinent testing, and imaging results were personally reviewed by me. This patient has been appropriately cleared  by  cardiology with an overall LOW risk of significant perioperative cardiovascular complications.  Based on clinical review performed today (04/29/21), barring any significant acute changes in the patient's overall condition, it is anticipated that she will be able to proceed with the planned surgical intervention. Any acute changes in clinical condition may necessitate her procedure being postponed and/or cancelled. Patient will meet with anesthesia team (MD and/or CRNA) on the day of her procedure for preoperative evaluation/assessment. Questions regarding anesthetic course will be fielded at that time.   Pre-surgical instructions were reviewed with the patient during her PAT appointment and questions were fielded by PAT clinical staff. Patient was advised that if any questions or concerns arise prior to her procedure then she should return a call to PAT and/or her surgeon's office to discuss.  Quentin Mulling, MSN, APRN, FNP-C, CEN [facility]  Peri-operative Services Nurse Practitioner Phone: [phone] Fax: [phone] 04/29/21 4:30 PM  NOTE: This note has been prepared using Dragon dictation software. Despite my best ability to proofread, there is always the potential that unintentional transcriptional errors may still occur from this process.

## 2021-04-29 ENCOUNTER — Other Ambulatory Visit: Payer: Self-pay

## 2021-04-29 ENCOUNTER — Other Ambulatory Visit
Admission: RE | Admit: 2021-04-29 | Discharge: 2021-04-29 | Disposition: A | Discharge status: Home / Self Care | Payer: Medicaid Other | Source: Ambulatory Visit | Attending: Vascular Surgery | Admitting: Vascular Surgery

## 2021-04-29 DIAGNOSIS — Z20822 Contact with and (suspected) exposure to covid-19: Secondary | ICD-10-CM | POA: Insufficient documentation

## 2021-04-29 DIAGNOSIS — Z01818 Encounter for other preprocedural examination: Secondary | ICD-10-CM | POA: Insufficient documentation

## 2021-04-29 LAB — TYPE AND SCREEN
ABO/RH(D): O POS
Antibody Screen: NEGATIVE

## 2021-04-29 LAB — CBC WITH DIFFERENTIAL/PLATELET
Abs Immature Granulocytes: 0.05 10*3/uL (ref 0.00–0.07)
Basophils Absolute: 0.1 10*3/uL (ref 0.0–0.1)
Basophils Relative: 1 %
Eosinophils Absolute: 0.1 10*3/uL (ref 0.0–0.5)
Eosinophils Relative: 1 %
HCT: 34 % — ABNORMAL LOW (ref 36.0–46.0)
Hemoglobin: 10.4 g/dL — ABNORMAL LOW (ref 12.0–15.0)
Immature Granulocytes: 0 %
Lymphocytes Relative: 24 %
Lymphs Abs: 2.7 10*3/uL (ref 0.7–4.0)
MCH: 21.4 pg — ABNORMAL LOW (ref 26.0–34.0)
MCHC: 30.6 g/dL (ref 30.0–36.0)
MCV: 70.1 fL — ABNORMAL LOW (ref 80.0–100.0)
Monocytes Absolute: 0.5 10*3/uL (ref 0.1–1.0)
Monocytes Relative: 5 %
Neutro Abs: 7.7 10*3/uL (ref 1.7–7.7)
Neutrophils Relative %: 69 %
Platelets: 435 10*3/uL — ABNORMAL HIGH (ref 150–400)
RBC: 4.85 MIL/uL (ref 3.87–5.11)
RDW: 21.4 % — ABNORMAL HIGH (ref 11.5–15.5)
Smear Review: NORMAL
WBC: 11.2 10*3/uL — ABNORMAL HIGH (ref 4.0–10.5)
nRBC: 0 % (ref 0.0–0.2)

## 2021-04-29 LAB — BASIC METABOLIC PANEL
Anion gap: 9 (ref 5–15)
BUN: 9 mg/dL (ref 6–20)
CO2: 25 mmol/L (ref 22–32)
Calcium: 9.1 mg/dL (ref 8.9–10.3)
Chloride: 97 mmol/L — ABNORMAL LOW (ref 98–111)
Creatinine, Ser: 0.5 mg/dL (ref 0.44–1.00)
GFR, Estimated: >60 mL/min (ref >60)
Glucose, Bld: 165 mg/dL — ABNORMAL HIGH (ref 70–99)
Potassium: 4.3 mmol/L (ref 3.5–5.1)
Sodium: 131 mmol/L — ABNORMAL LOW (ref 135–145)

## 2021-04-29 LAB — HEMOGLOBIN A1C
Hgb A1c MFr Bld: 6.7 % — ABNORMAL HIGH (ref 4.8–5.6)
Mean Plasma Glucose: 145.59 mg/dL

## 2021-04-29 LAB — SARS CORONAVIRUS 2 (TAT 6-24 HRS): SARS Coronavirus 2: NEGATIVE

## 2021-05-01 DIAGNOSIS — I714 Abdominal aortic aneurysm, without rupture: Secondary | ICD-10-CM

## 2021-05-02 ENCOUNTER — Encounter
Admission: RE | Disposition: A | Discharge status: Home / Self Care | Payer: Self-pay | Source: Home / Self Care | Attending: Vascular Surgery

## 2021-05-02 ENCOUNTER — Encounter: Payer: Self-pay | Admitting: Vascular Surgery

## 2021-05-02 ENCOUNTER — Inpatient Hospital Stay: Payer: Self-pay | Admitting: Urgent Care

## 2021-05-02 ENCOUNTER — Inpatient Hospital Stay
Admission: RE | Admit: 2021-05-02 | Discharge: 2021-05-03 | DRG: 269 | Disposition: A | Discharge status: Home / Self Care | Payer: Self-pay | Attending: Vascular Surgery | Admitting: Vascular Surgery

## 2021-05-02 DIAGNOSIS — K219 Gastro-esophageal reflux disease without esophagitis: Secondary | ICD-10-CM | POA: Diagnosis present

## 2021-05-02 DIAGNOSIS — I252 Old myocardial infarction: Secondary | ICD-10-CM

## 2021-05-02 DIAGNOSIS — I714 Abdominal aortic aneurysm, without rupture, unspecified: Secondary | ICD-10-CM | POA: Diagnosis present

## 2021-05-02 DIAGNOSIS — I2510 Atherosclerotic heart disease of native coronary artery without angina pectoris: Secondary | ICD-10-CM | POA: Diagnosis present

## 2021-05-02 DIAGNOSIS — F32A Depression, unspecified: Secondary | ICD-10-CM | POA: Diagnosis present

## 2021-05-02 DIAGNOSIS — Z955 Presence of coronary angioplasty implant and graft: Secondary | ICD-10-CM

## 2021-05-02 DIAGNOSIS — F419 Anxiety disorder, unspecified: Secondary | ICD-10-CM | POA: Diagnosis present

## 2021-05-02 DIAGNOSIS — Z885 Allergy status to narcotic agent status: Secondary | ICD-10-CM

## 2021-05-02 DIAGNOSIS — Z7901 Long term (current) use of anticoagulants: Secondary | ICD-10-CM

## 2021-05-02 DIAGNOSIS — Z833 Family history of diabetes mellitus: Secondary | ICD-10-CM

## 2021-05-02 DIAGNOSIS — I739 Peripheral vascular disease, unspecified: Secondary | ICD-10-CM | POA: Diagnosis present

## 2021-05-02 DIAGNOSIS — Z7902 Long term (current) use of antithrombotics/antiplatelets: Secondary | ICD-10-CM

## 2021-05-02 DIAGNOSIS — Z7982 Long term (current) use of aspirin: Secondary | ICD-10-CM

## 2021-05-02 DIAGNOSIS — I10 Essential (primary) hypertension: Secondary | ICD-10-CM | POA: Diagnosis present

## 2021-05-02 DIAGNOSIS — Z87891 Personal history of nicotine dependence: Secondary | ICD-10-CM

## 2021-05-02 DIAGNOSIS — Z79899 Other long term (current) drug therapy: Secondary | ICD-10-CM

## 2021-05-02 DIAGNOSIS — Z888 Allergy status to other drugs, medicaments and biological substances status: Secondary | ICD-10-CM

## 2021-05-02 DIAGNOSIS — E785 Hyperlipidemia, unspecified: Secondary | ICD-10-CM | POA: Diagnosis present

## 2021-05-02 HISTORY — DX: Long term (current) use of anticoagulants: Z79.01

## 2021-05-02 HISTORY — DX: Other ill-defined heart diseases: I51.89

## 2021-05-02 HISTORY — DX: Hyperlipidemia, unspecified: E78.5

## 2021-05-02 HISTORY — PX: ENDOVASCULAR REPAIR/STENT GRAFT: CATH118280

## 2021-05-02 HISTORY — DX: Type 2 diabetes mellitus without complications: E11.9

## 2021-05-02 HISTORY — DX: Peripheral vascular disease, unspecified: I73.9

## 2021-05-02 HISTORY — DX: Stricture of artery: I77.1

## 2021-05-02 LAB — ABO/RH: ABO/RH(D): O POS

## 2021-05-02 LAB — GLUCOSE, CAPILLARY
Glucose-Capillary: 175 mg/dL — ABNORMAL HIGH (ref 70–99)
Glucose-Capillary: 215 mg/dL — ABNORMAL HIGH (ref 70–99)
Glucose-Capillary: 267 mg/dL — ABNORMAL HIGH (ref 70–99)
Glucose-Capillary: 185 mg/dL — ABNORMAL HIGH (ref 70–99)

## 2021-05-02 LAB — MRSA NEXT GEN BY PCR, NASAL: MRSA by PCR Next Gen: NOT DETECTED

## 2021-05-02 SURGERY — ENDOVASCULAR REPAIR/STENT GRAFT
Anesthesia: General

## 2021-05-02 MED ORDER — MIDAZOLAM HCL 2 MG/2ML IJ SOLN
INTRAMUSCULAR | Status: DC | PRN
  Administered 2021-05-02 (×2): 1 mg via INTRAVENOUS

## 2021-05-02 MED ORDER — ATORVASTATIN CALCIUM 20 MG PO TABS
80.0000 mg | ORAL_TABLET | Freq: Every day | ORAL | Status: DC
  Administered 2021-05-02 – 2021-05-03 (×2): 80 mg via ORAL
  Filled 2021-05-02 (×2): qty 4

## 2021-05-02 MED ORDER — CHLORHEXIDINE GLUCONATE 0.12 % MT SOLN
15.0000 mL | Freq: Once | OROMUCOSAL | Status: DC
  Filled 2021-05-02: qty 15

## 2021-05-02 MED ORDER — FENTANYL CITRATE (PF) 100 MCG/2ML IJ SOLN
INTRAMUSCULAR | Status: AC
  Filled 2021-05-02: qty 2

## 2021-05-02 MED ORDER — MAGNESIUM SULFATE 2 GM/50ML IV SOLN
2.0000 g | Freq: Every day | INTRAVENOUS | Status: DC | PRN
  Filled 2021-05-02: qty 50

## 2021-05-02 MED ORDER — SUGAMMADEX SODIUM 200 MG/2ML IV SOLN
INTRAVENOUS | Status: DC | PRN
  Administered 2021-05-02: 200 mg via INTRAVENOUS

## 2021-05-02 MED ORDER — MIDAZOLAM HCL 2 MG/2ML IJ SOLN
INTRAMUSCULAR | Status: AC
  Filled 2021-05-02: qty 2

## 2021-05-02 MED ORDER — ROCURONIUM BROMIDE 100 MG/10ML IV SOLN
INTRAVENOUS | Status: DC | PRN
  Administered 2021-05-02: 50 mg via INTRAVENOUS
  Administered 2021-05-02: 30 mg via INTRAVENOUS
  Administered 2021-05-02: 20 mg via INTRAVENOUS

## 2021-05-02 MED ORDER — ALUM & MAG HYDROXIDE-SIMETH 200-200-20 MG/5ML PO SUSP: 15.0000 mL | ORAL | Status: DC | PRN

## 2021-05-02 MED ORDER — INSULIN ASPART 100 UNIT/ML IJ SOLN
0.0000 [IU] | Freq: Three times a day (TID) | INTRAMUSCULAR | Status: DC
Start: 2021-05-03 — End: 2021-05-03
  Administered 2021-05-03: 2 [IU] via SUBCUTANEOUS
  Administered 2021-05-03: 3 [IU] via SUBCUTANEOUS
  Filled 2021-05-02 (×2): qty 1

## 2021-05-02 MED ORDER — PANTOPRAZOLE SODIUM 40 MG PO TBEC
40.0000 mg | DELAYED_RELEASE_TABLET | Freq: Every day | ORAL | Status: DC
  Administered 2021-05-03: 40 mg via ORAL
  Filled 2021-05-02: qty 1

## 2021-05-02 MED ORDER — PROPOFOL 10 MG/ML IV BOLUS
INTRAVENOUS | Status: DC | PRN
  Administered 2021-05-02: 100 mg via INTRAVENOUS

## 2021-05-02 MED ORDER — LIDOCAINE HCL (PF) 2 % IJ SOLN
INTRAMUSCULAR | Status: AC
  Filled 2021-05-02: qty 4

## 2021-05-02 MED ORDER — FENTANYL CITRATE (PF) 100 MCG/2ML IJ SOLN
INTRAMUSCULAR | Status: DC | PRN
  Administered 2021-05-02 (×2): 50 ug via INTRAVENOUS

## 2021-05-02 MED ORDER — LOSARTAN POTASSIUM 50 MG PO TABS
100.0000 mg | ORAL_TABLET | Freq: Every day | ORAL | Status: DC
  Administered 2021-05-02 – 2021-05-03 (×2): 100 mg via ORAL
  Filled 2021-05-02 (×2): qty 2

## 2021-05-02 MED ORDER — ROCURONIUM BROMIDE 10 MG/ML (PF) SYRINGE
PREFILLED_SYRINGE | INTRAVENOUS | Status: AC
  Filled 2021-05-02: qty 10

## 2021-05-02 MED ORDER — ONDANSETRON HCL 4 MG/2ML IJ SOLN
INTRAMUSCULAR | Status: DC | PRN
  Administered 2021-05-02: 4 mg via INTRAVENOUS

## 2021-05-02 MED ORDER — SODIUM CHLORIDE 0.9 % IV SOLN
INTRAVENOUS | Status: DC | PRN
  Administered 2021-05-02: 40 ug/min via INTRAVENOUS

## 2021-05-02 MED ORDER — FAMOTIDINE IN NACL 20-0.9 MG/50ML-% IV SOLN
20.0000 mg | Freq: Two times a day (BID) | INTRAVENOUS | Status: DC
  Administered 2021-05-02: 20 mg via INTRAVENOUS
  Filled 2021-05-02: qty 50

## 2021-05-02 MED ORDER — EPHEDRINE SULFATE 50 MG/ML IJ SOLN
INTRAMUSCULAR | Status: DC | PRN
  Administered 2021-05-02: 5 mg via INTRAVENOUS

## 2021-05-02 MED ORDER — FENTANYL CITRATE (PF) 100 MCG/2ML IJ SOLN
25.0000 ug | INTRAMUSCULAR | Status: DC | PRN
  Administered 2021-05-02 (×3): 25 ug via INTRAVENOUS

## 2021-05-02 MED ORDER — ACETAMINOPHEN 325 MG RE SUPP
325.0000 mg | RECTAL | Status: DC | PRN
  Filled 2021-05-02: qty 2

## 2021-05-02 MED ORDER — CLOPIDOGREL BISULFATE 75 MG PO TABS
75.0000 mg | ORAL_TABLET | Freq: Every day | ORAL | Status: DC
  Administered 2021-05-02 – 2021-05-03 (×2): 75 mg via ORAL
  Filled 2021-05-02 (×2): qty 1

## 2021-05-02 MED ORDER — ONDANSETRON HCL 4 MG/2ML IJ SOLN: 4.0000 mg | Freq: Four times a day (QID) | INTRAMUSCULAR | Status: DC | PRN

## 2021-05-02 MED ORDER — CEFAZOLIN SODIUM-DEXTROSE 2-4 GM/100ML-% IV SOLN
2.0000 g | Freq: Three times a day (TID) | INTRAVENOUS | Status: AC
Start: 2021-05-02 — End: 2021-05-03
  Administered 2021-05-02 – 2021-05-03 (×2): 2 g via INTRAVENOUS
  Filled 2021-05-02 (×2): qty 100

## 2021-05-02 MED ORDER — CHLORHEXIDINE GLUCONATE CLOTH 2 % EX PADS: 6.0000 | MEDICATED_PAD | Freq: Once | CUTANEOUS | Status: DC

## 2021-05-02 MED ORDER — PHENOL 1.4 % MT LIQD
1.0000 | OROMUCOSAL | Status: DC | PRN
  Filled 2021-05-02: qty 177

## 2021-05-02 MED ORDER — ONDANSETRON HCL 4 MG/2ML IJ SOLN
INTRAMUSCULAR | Status: AC
  Filled 2021-05-02: qty 2

## 2021-05-02 MED ORDER — NITROGLYCERIN IN D5W 200-5 MCG/ML-% IV SOLN
5.0000 ug/min | INTRAVENOUS | Status: DC
Start: 2021-05-02 — End: 2021-05-03
  Filled 2021-05-02: qty 250

## 2021-05-02 MED ORDER — SODIUM CHLORIDE 0.9 % IV SOLN: INTRAVENOUS | Status: DC

## 2021-05-02 MED ORDER — CITALOPRAM HYDROBROMIDE 20 MG PO TABS
40.0000 mg | ORAL_TABLET | Freq: Every day | ORAL | Status: DC
  Administered 2021-05-02 – 2021-05-03 (×2): 40 mg via ORAL
  Filled 2021-05-02 (×2): qty 2

## 2021-05-02 MED ORDER — DOCUSATE SODIUM 100 MG PO CAPS
100.0000 mg | ORAL_CAPSULE | Freq: Every day | ORAL | Status: DC
  Administered 2021-05-03: 100 mg via ORAL
  Filled 2021-05-02: qty 1

## 2021-05-02 MED ORDER — POTASSIUM CHLORIDE CRYS ER 20 MEQ PO TBCR: 20.0000 meq | EXTENDED_RELEASE_TABLET | Freq: Every day | ORAL | Status: DC | PRN

## 2021-05-02 MED ORDER — LIDOCAINE HCL (CARDIAC) PF 100 MG/5ML IV SOSY
PREFILLED_SYRINGE | INTRAVENOUS | Status: DC | PRN
  Administered 2021-05-02: 100 mg via INTRAVENOUS

## 2021-05-02 MED ORDER — NITROGLYCERIN 0.4 MG SL SUBL: 0.4000 mg | SUBLINGUAL_TABLET | SUBLINGUAL | Status: DC | PRN

## 2021-05-02 MED ORDER — CYCLOBENZAPRINE HCL 10 MG PO TABS
10.0000 mg | ORAL_TABLET | Freq: Three times a day (TID) | ORAL | Status: DC | PRN
  Filled 2021-05-02: qty 1

## 2021-05-02 MED ORDER — INSULIN ASPART 100 UNIT/ML IJ SOLN: 0.0000 [IU] | Freq: Every day | INTRAMUSCULAR | Status: DC

## 2021-05-02 MED ORDER — LOSARTAN POTASSIUM-HCTZ 100-25 MG PO TABS: 1.0000 | ORAL_TABLET | Freq: Every day | ORAL | Status: DC

## 2021-05-02 MED ORDER — MORPHINE SULFATE (PF) 2 MG/ML IV SOLN
2.0000 mg | INTRAVENOUS | Status: DC | PRN
  Administered 2021-05-02 – 2021-05-03 (×2): 2 mg via INTRAVENOUS
  Filled 2021-05-02 (×2): qty 1

## 2021-05-02 MED ORDER — ACETAMINOPHEN 325 MG PO TABS
ORAL_TABLET | ORAL | Status: AC
  Filled 2021-05-02: qty 2

## 2021-05-02 MED ORDER — HYDRALAZINE HCL 20 MG/ML IJ SOLN
5.0000 mg | INTRAMUSCULAR | Status: AC | PRN
Start: 2021-05-02 — End: 2021-05-02
  Administered 2021-05-02: 5 mg via INTRAVENOUS

## 2021-05-02 MED ORDER — ACETAMINOPHEN 325 MG PO TABS
325.0000 mg | ORAL_TABLET | ORAL | Status: DC | PRN
  Administered 2021-05-02 (×2): 650 mg via ORAL
  Filled 2021-05-02: qty 2

## 2021-05-02 MED ORDER — HYDROMORPHONE HCL 1 MG/ML IJ SOLN: 1.0000 mg | Freq: Once | INTRAMUSCULAR | Status: DC | PRN

## 2021-05-02 MED ORDER — OXYCODONE-ACETAMINOPHEN 5-325 MG PO TABS: 1.0000 | ORAL_TABLET | Freq: Four times a day (QID) | ORAL | Status: DC | PRN

## 2021-05-02 MED ORDER — CARVEDILOL 3.125 MG PO TABS
3.1250 mg | ORAL_TABLET | Freq: Two times a day (BID) | ORAL | Status: DC
  Administered 2021-05-02 – 2021-05-03 (×2): 3.125 mg via ORAL
  Filled 2021-05-02 (×2): qty 1

## 2021-05-02 MED ORDER — ASPIRIN 81 MG PO CHEW
81.0000 mg | CHEWABLE_TABLET | Freq: Every day | ORAL | Status: DC
  Administered 2021-05-02 – 2021-05-03 (×2): 81 mg via ORAL
  Filled 2021-05-02 (×2): qty 1

## 2021-05-02 MED ORDER — PHENYLEPHRINE HCL (PRESSORS) 10 MG/ML IV SOLN
INTRAVENOUS | Status: DC | PRN
  Administered 2021-05-02: 100 ug via INTRAVENOUS
  Administered 2021-05-02: 200 ug via INTRAVENOUS
  Administered 2021-05-02: 100 ug via INTRAVENOUS
  Administered 2021-05-02: 50 ug via INTRAVENOUS
  Administered 2021-05-02 (×2): 100 ug via INTRAVENOUS

## 2021-05-02 MED ORDER — OXYCODONE-ACETAMINOPHEN 5-325 MG PO TABS
1.0000 | ORAL_TABLET | ORAL | Status: DC | PRN
Start: 2021-05-02 — End: 2021-05-03
  Administered 2021-05-02 – 2021-05-03 (×3): 2 via ORAL
  Filled 2021-05-02 (×3): qty 2

## 2021-05-02 MED ORDER — DOPAMINE-DEXTROSE 3.2-5 MG/ML-% IV SOLN
3.0000 ug/kg/min | INTRAVENOUS | Status: DC
  Filled 2021-05-02: qty 250

## 2021-05-02 MED ORDER — HYDRALAZINE HCL 20 MG/ML IJ SOLN
INTRAMUSCULAR | Status: AC
  Administered 2021-05-02: 5 mg via INTRAVENOUS
  Filled 2021-05-02: qty 1

## 2021-05-02 MED ORDER — CEFAZOLIN SODIUM-DEXTROSE 2-4 GM/100ML-% IV SOLN
2.0000 g | INTRAVENOUS | Status: AC
  Administered 2021-05-02: 2 g via INTRAVENOUS

## 2021-05-02 MED ORDER — SODIUM CHLORIDE 0.9 % IV SOLN: 500.0000 mL | Freq: Once | INTRAVENOUS | Status: DC | PRN

## 2021-05-02 MED ORDER — PHENYLEPHRINE HCL (PRESSORS) 10 MG/ML IV SOLN
INTRAVENOUS | Status: AC
  Filled 2021-05-02: qty 1

## 2021-05-02 MED ORDER — TRAMADOL HCL 50 MG PO TABS: 50.0000 mg | ORAL_TABLET | Freq: Two times a day (BID) | ORAL | Status: DC

## 2021-05-02 MED ORDER — PAROXETINE HCL 20 MG PO TABS: 40.0000 mg | ORAL_TABLET | Freq: Every day | ORAL | Status: DC

## 2021-05-02 MED ORDER — HYDROCHLOROTHIAZIDE 25 MG PO TABS
25.0000 mg | ORAL_TABLET | Freq: Every day | ORAL | Status: DC
  Administered 2021-05-02 – 2021-05-03 (×2): 25 mg via ORAL
  Filled 2021-05-02 (×2): qty 1

## 2021-05-02 MED ORDER — ONDANSETRON HCL 4 MG/2ML IJ SOLN: 4.0000 mg | Freq: Once | INTRAMUSCULAR | Status: DC | PRN

## 2021-05-02 MED ORDER — GUAIFENESIN-DM 100-10 MG/5ML PO SYRP: 15.0000 mL | ORAL_SOLUTION | ORAL | Status: DC | PRN

## 2021-05-02 MED ORDER — DEXAMETHASONE SODIUM PHOSPHATE 10 MG/ML IJ SOLN
INTRAMUSCULAR | Status: AC
  Filled 2021-05-02: qty 1

## 2021-05-02 MED ORDER — DEXAMETHASONE SODIUM PHOSPHATE 10 MG/ML IJ SOLN
INTRAMUSCULAR | Status: DC | PRN
  Administered 2021-05-02: 5 mg via INTRAVENOUS

## 2021-05-02 MED ORDER — IODIXANOL 320 MG/ML IV SOLN
INTRAVENOUS | Status: DC | PRN
  Administered 2021-05-02: 25 mL via INTRA_ARTERIAL

## 2021-05-02 MED ORDER — FENTANYL CITRATE (PF) 100 MCG/2ML IJ SOLN
INTRAMUSCULAR | Status: AC
  Administered 2021-05-02: 25 ug via INTRAVENOUS
  Filled 2021-05-02: qty 2

## 2021-05-02 MED ORDER — DEXMEDETOMIDINE (PRECEDEX) IN NS 20 MCG/5ML (4 MCG/ML) IV SYRINGE
PREFILLED_SYRINGE | INTRAVENOUS | Status: DC | PRN
  Administered 2021-05-02: 8 ug via INTRAVENOUS

## 2021-05-02 MED ORDER — HEPARIN SODIUM (PORCINE) 1000 UNIT/ML IJ SOLN
INTRAMUSCULAR | Status: DC | PRN
  Administered 2021-05-02: 6000 [IU] via INTRAVENOUS

## 2021-05-02 MED ORDER — ORAL CARE MOUTH RINSE: 15.0000 mL | Freq: Once | OROMUCOSAL | Status: DC

## 2021-05-02 MED ORDER — PROPOFOL 10 MG/ML IV BOLUS
INTRAVENOUS | Status: AC
  Filled 2021-05-02: qty 20

## 2021-05-02 SURGICAL SUPPLY — 27 items
BALLN ATG 12X6X80 (BALLOONS) ×4
BALLOON ATG 12X6X80 (BALLOONS) ×2 IMPLANT
CATH ACCU-VU SIZ PIG 5F 70CM (CATHETERS) ×2 IMPLANT
CATH BALLN CODA 9X100X32 (BALLOONS) ×2 IMPLANT
CATH BEACON 5 .035 65 KMP TIP (CATHETERS) ×2 IMPLANT
CATH VISIONS PV .035 IVUS (CATHETERS) ×2 IMPLANT
CLOSURE PERCLOSE PROSTYLE (VASCULAR PRODUCTS) ×10 IMPLANT
COVER DRAPE FLUORO 36X44 (DRAPES) ×4 IMPLANT
DEVICE SAFEGUARD 24CM (GAUZE/BANDAGES/DRESSINGS) ×4 IMPLANT
DEVICE TORQUE (MISCELLANEOUS) ×2 IMPLANT
DRYSEAL FLEXSHEATH 16FR 33CM (SHEATH) ×3
EXCLDR TRNK ENDO 26X14.5X12 16 (Endovascular Graft) ×2 IMPLANT
EXCLUDER TNK END 26X14.5X12 16 (Endovascular Graft) ×1 IMPLANT
GLIDEWIRE STIFF .35X180X3 HYDR (WIRE) ×2 IMPLANT
KIT ENCORE 26 ADVANTAGE (KITS) ×4 IMPLANT
LEG CONTRALATERAL 16X18X9.5 (Endovascular Graft) ×2 IMPLANT
LEG CONTRALATERAL 23X12 (Endovascular Graft) ×2 IMPLANT
NEEDLE ENTRY 21GA 7CM ECHOTIP (NEEDLE) ×2 IMPLANT
PACK ANGIOGRAPHY (CUSTOM PROCEDURE TRAY) ×2 IMPLANT
SET INTRO CAPELLA COAXIAL (SET/KITS/TRAYS/PACK) ×2 IMPLANT
SHEATH BRITE TIP 6FRX11 (SHEATH) ×4 IMPLANT
SHEATH BRITE TIP 8FRX11 (SHEATH) ×4 IMPLANT
SHEATH DRYSEAL FLEX 16FR 33CM (SHEATH) ×3 IMPLANT
SPONGE XRAY 4X4 16PLY STRL (MISCELLANEOUS) ×4 IMPLANT
SYR MEDRAD MARK 7 150ML (SYRINGE) ×4 IMPLANT
WIRE AMPLATZ SSTIFF .035X260CM (WIRE) ×4 IMPLANT
WIRE GUIDERIGHT .035X150 (WIRE) ×4 IMPLANT

## 2021-05-02 NOTE — Plan of Care (Signed)
Plan of care reviewed with pt.

## 2021-05-02 NOTE — OR Nursing (Signed)
Called ICU to inform them that I would be bringing patient there in 15 minutes.  Charge nurse, Maralyn Sago, informed me that there was no available nurse at this time. They were expecting a STEMI and another patient from the floor.  They need to transfer other patients off the unit before they can receive Emily Krause.

## 2021-05-02 NOTE — Anesthesia Preprocedure Evaluation (Addendum)
Anesthesia Evaluation  Patient identified by MRN, date of birth, ID band Patient awake    Reviewed: Allergy & Precautions, H&P , NPO status , Patient's Chart, lab work & pertinent test results, reviewed documented beta blocker date and time   Airway Mallampati: II  TM Distance: >3 FB Neck ROM: full    Dental  (+) Upper Dentures, Lower Dentures   Pulmonary pneumonia, resolved, Current Smoker,    Pulmonary exam normal        Cardiovascular Exercise Tolerance: Poor hypertension, On Medications (-) angina+ CAD, + Past MI, + Cardiac Stents and + Peripheral Vascular Disease  (-) CHF and (-) DVT Normal cardiovascular exam Rate:Normal     Neuro/Psych  Headaches, Anxiety Depression negative psych ROS   GI/Hepatic Neg liver ROS, GERD  Medicated,  Endo/Other  negative endocrine ROSdiabetes, Well Controlled, Type 2, Oral Hypoglycemic Agents  Renal/GU negative Renal ROS  negative genitourinary   Musculoskeletal   Abdominal   Peds  Hematology negative hematology ROS (+)   Anesthesia Other Findings   Reproductive/Obstetrics negative OB ROS                             Anesthesia Physical Anesthesia Plan  ASA: 3  Anesthesia Plan: General LMA   Post-op Pain Management:    Induction: Intravenous  PONV Risk Score and Plan: 3  Airway Management Planned: Oral ETT  Additional Equipment:   Intra-op Plan:   Post-operative Plan: Extubation in OR  Informed Consent: I have reviewed the patients History and Physical, chart, labs and discussed the procedure including the risks, benefits and alternatives for the proposed anesthesia with the patient or authorized representative who has indicated his/her understanding and acceptance.     Dental Advisory Given  Plan Discussed with: CRNA  Anesthesia Plan Comments: (Patient consented for risks of anesthesia including but not limited to:  - adverse  reactions to medications - damage to eyes, teeth, lips or other oral mucosa - nerve damage due to positioning  - sore throat or hoarseness - Damage to heart, brain, nerves, lungs, other parts of body or loss of life  Patient voiced understanding.)       Anesthesia Quick Evaluation

## 2021-05-02 NOTE — Interval H&P Note (Signed)
History and Physical Interval Note:  05/02/2021 7:32 AM  Emily Krause  has presented today for surgery, with the diagnosis of Endovascular stent graft repair  GORE  Anesthesia    AAA Dr Wyn Quaker w Dr Gilda Crease to assist.  The various methods of treatment have been discussed with the patient and family. After consideration of risks, benefits and other options for treatment, the patient has consented to  Procedure(s): ENDOVASCULAR REPAIR/STENT GRAFT (N/A) as a surgical intervention.  The patient's history has been reviewed, patient examined, no change in status, stable for surgery.  I have reviewed the patient's chart and labs.  Questions were answered to the patient's satisfaction.     [name]

## 2021-05-02 NOTE — Transfer of Care (Cosign Needed)
Immediate Anesthesia Transfer of Care Note  Patient: Emily Krause  Procedure(s) Performed: ENDOVASCULAR REPAIR/STENT GRAFT  Patient Location: PACU  Anesthesia Type:General  Level of Consciousness: drowsy  Airway & Oxygen Therapy: Patient Spontanous Breathing and Patient connected to face mask oxygen  Post-op Assessment: Report given to RN and Post -op Vital signs reviewed and stable  Post vital signs: Reviewed and stable  Last Vitals:  Vitals Value Taken Time  BP 145/75 05/02/21 1001  Temp 36.4 C 05/02/21 1000  Pulse 112 05/02/21 1007  Resp 21 05/02/21 1007  SpO2 100 % 05/02/21 1007  Vitals shown include unvalidated device data.  Last Pain:  Vitals:   05/02/21 0714  PainSc: 8          Complications: No notable events documented.

## 2021-05-02 NOTE — Anesthesia Postprocedure Evaluation (Signed)
Anesthesia Post Note  Patient: Emily Krause  Procedure(s) Performed: ENDOVASCULAR REPAIR/STENT GRAFT  Patient location during evaluation: PACU Anesthesia Type: General Level of consciousness: awake and alert Pain management: pain level controlled Vital Signs Assessment: post-procedure vital signs reviewed and stable Respiratory status: spontaneous breathing, nonlabored ventilation, respiratory function stable and patient connected to nasal cannula oxygen Cardiovascular status: blood pressure returned to baseline and stable Postop Assessment: no apparent nausea or vomiting Anesthetic complications: no   No notable events documented.   Last Vitals:  Vitals:   05/02/21 1110 05/02/21 1115  BP:  129/68  Pulse: 94 92  Resp: 20 20  Temp:    SpO2: 94% 93%    Last Pain:  Vitals:   05/02/21 1100  PainSc: 0-No pain                 Cleda Mccreedy Achillies Buehl

## 2021-05-02 NOTE — Op Note (Signed)
OPERATIVE NOTE   PROCEDURE: US guidance for vascular access, bilateral femoral arteries Catheter placement into aorta from bilateral femoral approaches Placement of a 26 x 14 x 12 conformable Gore Excluder Endoprosthesis main body with a 23 x 12 contralateral limb and a 18 x 10 ipsilateral limb ProGlide closure devices bilateral femoral arteries  PRE-OPERATIVE DIAGNOSIS: AAA  POST-OPERATIVE DIAGNOSIS: same  SURGEON: Hortencia Pilar, MD and Leotis Pain, MD - Co-surgeons  ANESTHESIA: general  ESTIMATED BLOOD LOSS: 25 cc  FINDING(S): 1.  AAA  SPECIMEN(S):  none  INDICATIONS:   Emily Krause is a 60 y.o. y.o. female who presents with severe atherosclerotic occlusive disease.  She was found to have subtotal occlusion of the origins of both common iliacs in association with a greater than 5 cm abdominal aortic aneurysm.  Risks and benefits for endovascular repair were reviewed all questions been answered and she wishes to proceed with stent graft placement to prevent lethal rupture..  DESCRIPTION: After obtaining full informed written consent, the patient was brought back to the operating room and placed supine upon the operating table.  The patient received IV antibiotics prior to induction.  After obtaining adequate anesthesia, the patient was prepped and draped in the standard fashion for endovascular AAA repair.  Co-surgeons are required because this is a complex bilateral procedure with work being performed simultaneously from both the right femoral and left femoral approach.  This also expedites the procedure making a shorter operative time reducing complications and improving patient safety.  We then began by gaining access to both femoral arteries with US guidance with me working on the patient's left and Dr. Lucky Cowboy working on the patient's right.  The femoral arteries were found to be patent and accessed without difficulty with a needle under ultrasound guidance without difficulty  on each side and permanent images were recorded.  We then placed 2 proglide devices on each side in a pre-close fashion and placed 8 French sheaths.  The patient was then given 6000 units of intravenous heparin.   The Pigtail catheter was placed into the aorta from the right side.  Next the Amplatz Super Stiff wire was advanced through the pigtail catheter catheter was removed the 8 French sheath was then exchanged for a 16 French sheath over the stiff wire.  Following this, the [facility] IVUS catheter was advanced over the wire and serial measurements were made of the aorta as well as the right iliac system.  Later the left sheath was upsized to a 16 [place] sheath and again the IVUS catheter was introduced and measurements were made through the left iliac.  Using this information, we selected a 26 x 14 x 12 conformable Main body device.  The main body was then placed through the 16 French sheath. A Kumpe catheter was placed up the left side and a magnified image at the renal arteries was performed. The main body was then deployed just below the lowest renal artery. The Kumpe catheter was used to cannulate the contralateral gate without difficulty and successful cannulation was confirmed by introducing the IVUS catheter up the left side and verifying placement. We then placed a stiff wire and a retrograde arteriogram was performed through the left femoral sheath.  A 23 x 12 contralateral limb was selected and deployed. The main body deployment was then completed. Based off the angiographic findings, extension limbs were necessary.  A 18 x 10 ipsilateral limb was then used to extend down to the right iliac bifurcation.  We then selected to 12 mm x 60 mm Atlas balloons and simultaneously angioplastied the distal aortic bifurcation including the proximal common iliacs to treat the subtotal occlusions at the origins of both common iliac arteries.  Inflations were to 10 atm for approximately 1 minute.  All junction  points and seals zones were then treated with the compliant balloon.   The pigtail catheter was then replaced and a completion angiogram was performed.   No endoleak was detected on completion angiography. The renal arteries were found to be widely patent.  Aortic bifurcation is now widely patent with less than 15% residual stenosis.   At this point we elected to terminate the procedure. We secured the pro glide devices for hemostasis on the femoral arteries. The skin incision was closed with a 4-0 Monocryl. Dermabond and pressure dressing were placed. The patient was taken to the recovery room in stable condition having tolerated the procedure well.  COMPLICATIONS: none  CONDITION: stable  Hortencia Pilar  05/02/2021, 9:44 AM

## 2021-05-02 NOTE — Anesthesia Procedure Notes (Signed)
Procedure Name: Intubation Date/Time: 05/02/2021 8:10 AM Performed by: Nelda Marseille, CRNA Pre-anesthesia Checklist: Patient identified, Patient being monitored, Timeout performed, Emergency Drugs available and Suction available Patient Re-evaluated:Patient Re-evaluated prior to induction Oxygen Delivery Method: Circle system utilized Preoxygenation: Pre-oxygenation with 100% oxygen Induction Type: IV induction Ventilation: Mask ventilation without difficulty Laryngoscope Size: Mac, 3 and McGraph Grade View: Grade II Tube type: Oral Tube size: 7.0 mm Number of attempts: 1 Airway Equipment and Method: Stylet Placement Confirmation: ETT inserted through vocal cords under direct vision, positive ETCO2 and breath sounds checked- equal and bilateral Secured at: 23 cm Tube secured with: Tape Dental Injury: Teeth and Oropharynx as per pre-operative assessment

## 2021-05-02 NOTE — Op Note (Signed)
OPERATIVE NOTE   PROCEDURE: US guidance for vascular access, bilateral femoral arteries Catheter placement into aorta from bilateral femoral approaches Intravascular ultrasound to bilateral iliac arteries and the aorta for measurements Placement of a 26 mm proximal conformable Gore Excluder Endoprosthesis main body right with a 23 mm x 12 cm left iliac contralateral limb Placement of an 18 mm x 10 cm right iliac extension limb ProGlide closure devices bilateral femoral arteries  PRE-OPERATIVE DIAGNOSIS: AAA, PAD with claudication  POST-OPERATIVE DIAGNOSIS: same  SURGEON: Emily Pain, MD and Hortencia Pilar, MD - Co-surgeons  ANESTHESIA: general  ESTIMATED BLOOD LOSS: 10 cc  FINDING(S): 1.  AAA  SPECIMEN(S):  none  INDICATIONS:   Emily Krause is a 60 y.o. female who presents with an aneurysm associated with significant iliac artery occlusive with disabling claudication. The anatomy was suitable for endovascular repair.  Risks and benefits of repair in an endovascular fashion were discussed and informed consent was obtained. Co-surgeons are used to expedite the procedure and reduce operative time as bilateral work needs to be done.  DESCRIPTION: After obtaining full informed written consent, the patient was brought back to the operating room and placed supine upon the operating table.  The patient received IV antibiotics prior to induction.  After obtaining adequate anesthesia, the patient was prepped and draped in the standard fashion for endovascular AAA repair.  We then began by gaining access to both femoral arteries with US guidance with me working on the right and Dr. Delana Meyer working on the left.  The femoral arteries were found to be patent and accessed without difficulty with a needle under ultrasound guidance without difficulty on each side and permanent images were recorded.  We then placed 2 proglide devices on each side in a pre-close fashion and placed 8 French sheaths.  The patient was then given 6000 units of intravenous heparin.  The Pigtail catheter was placed into the aorta from the right side. Over a stiff wire, a 16 French sheath was placed up the right side.  Through the right femoral sheath, the IVUS catheter was brought on the field to also assist with measurements in addition to the angiogram findings.  We measured about 20 to 22 mm in diameter in the infrarenal aortic neck and at the left renal artery which was lowest.  This measured 13 to 14 mm in the right common iliac artery as well below the origin stenosis.  The main body was then placed through the 16 French sheath. A Kumpe catheter was placed up the left side and a magnified image at the renal arteries was performed. The main body was then deployed just below the lowest renal artery. The Kumpe catheter was used to cannulate the contralateral gate without difficulty and successful cannulation was confirmed by twirling the pigtail catheter in the main body. We then placed a stiff wire and a retrograde arteriogram was performed through the left femoral sheath. We upsized to the 16 [place] sheath for the contralateral limb and the IVUS catheter was used to measure the left common iliac artery which measured in the 18 to 19 mm range.  A 23 mm diameter by 12 cm length left iliac limb was selected and deployed. The main body deployment was then completed. Based off the angiographic findings, extension limbs were necessary.  A right iliac extension limb measuring 18 mm in 10 cm length was deployed to the origin of the hypogastric artery on the right. All junction points and seals zones were  treated with the compliant balloon.  12 mm diameter noncompliant balloons were also used at the aortic bifurcation and proximal right and left common iliac arteries due to the high-grade stenosis that was creating much of her claudication symptoms.  These were inflated to 10 atm bilaterally.  The pigtail catheter was then replaced and a  completion angiogram was performed.  No endoleak was detected on completion angiography. The renal arteries were found to be widely patent.  Both hypogastric arteries were widely patent. At this point we elected to terminate the procedure. We secured the pro glide devices for hemostasis on the femoral arteries. The skin incision was closed with a 4-0 Monocryl. Dermabond and pressure dressing were placed. The patient was taken to the recovery room in stable condition having tolerated the procedure well.  COMPLICATIONS: none  CONDITION: stable  Emily Krause  05/02/2021, 10:07 AM   This note was created with Dragon Medical transcription system. Any errors in dictation are purely unintentional.

## 2021-05-03 ENCOUNTER — Encounter: Payer: Self-pay | Admitting: Vascular Surgery

## 2021-05-03 DIAGNOSIS — I714 Abdominal aortic aneurysm, without rupture: Principal | ICD-10-CM

## 2021-05-03 LAB — BASIC METABOLIC PANEL
Anion gap: 5 (ref 5–15)
BUN: 7 mg/dL (ref 6–20)
CO2: 27 mmol/L (ref 22–32)
Calcium: 8.6 mg/dL — ABNORMAL LOW (ref 8.9–10.3)
Chloride: 101 mmol/L (ref 98–111)
Creatinine, Ser: 0.48 mg/dL (ref 0.44–1.00)
GFR, Estimated: >60 mL/min (ref >60)
Glucose, Bld: 130 mg/dL — ABNORMAL HIGH (ref 70–99)
Potassium: 3.6 mmol/L (ref 3.5–5.1)
Sodium: 133 mmol/L — ABNORMAL LOW (ref 135–145)

## 2021-05-03 LAB — CBC
HCT: 27.1 % — ABNORMAL LOW (ref 36.0–46.0)
Hemoglobin: 8.4 g/dL — ABNORMAL LOW (ref 12.0–15.0)
MCH: 21.7 pg — ABNORMAL LOW (ref 26.0–34.0)
MCHC: 31 g/dL (ref 30.0–36.0)
MCV: 70 fL — ABNORMAL LOW (ref 80.0–100.0)
Platelets: 300 10*3/uL (ref 150–400)
RBC: 3.87 MIL/uL (ref 3.87–5.11)
RDW: 21.2 % — ABNORMAL HIGH (ref 11.5–15.5)
WBC: 11.5 10*3/uL — ABNORMAL HIGH (ref 4.0–10.5)
nRBC: 0 % (ref 0.0–0.2)

## 2021-05-03 LAB — GLUCOSE, CAPILLARY
Glucose-Capillary: 189 mg/dL — ABNORMAL HIGH (ref 70–99)
Glucose-Capillary: 139 mg/dL — ABNORMAL HIGH (ref 70–99)

## 2021-05-03 NOTE — Discharge Instructions (Addendum)
Vascular Surgery Discharge Instructions:  1) You may shower as of tomorrow.  Gently clean the groins with soap and water.  Gently pat dry. 2) Please do not engage in strenuous activity or lifting greater than 10 pounds until you are cleared at your first postoperative follow-up. 3) Please do not drive for at least 2 weeks.

## 2021-05-03 NOTE — Discharge Summary (Signed)
[facility]    Discharge Summary  Patient ID:  Emily Krause MRN: [id number] DOB/AGE: 02-13-1961 60 y.o.  Admit date: 05/02/2021 Discharge date: 05/03/2021 Date of Surgery: 05/02/2021 Surgeon: Surgeon(s): Dew, Marlow Baars, MD  Admission Diagnosis: AAA (abdominal aortic aneurysm) without rupture [facility]) [I71.4]  Discharge Diagnoses:  AAA (abdominal aortic aneurysm) without rupture (HCC) [I71.4]  Secondary Diagnoses: Past Medical History:  Diagnosis Date   AAA (abdominal aortic aneurysm) (HCC) 04/15/2021   a.) 4.9 cm infrarenal aneurysm noted on angiography study   Anxiety    Arthritis    Chronic anticoagulation    DAPT (ASA + clopidogrel)   Coronary artery disease    Depression    GERD (gastroesophageal reflux disease)    Grade I diastolic dysfunction    HLD (hyperlipidemia)    Hypertension    Iliac artery stenosis, right (HCC)    s/p SFA stenting on 04/15/2021   Migraine    NSTEMI (non-ST elevated myocardial infarction) (HCC) 12/04/2016   PCI with DES x 1 to mLCx   PAD (peripheral artery disease) (HCC)    Pneumonia    Stented coronary artery 12/04/2016   a.) 3x73mm EluNIR DES x 1 to mLCx   T2DM (type 2 diabetes mellitus) (HCC)    Procedure(s): US guidance for vascular access, bilateral femoral arteries Catheter placement into aorta from bilateral femoral approaches Intravascular ultrasound to bilateral iliac arteries and the aorta for measurements Placement of a 26 mm proximal conformable Gore Excluder Endoprosthesis main body right with a 23 mm x 12 cm left iliac contralateral limb Placement of an 18 mm x 10 cm right iliac extension limb ProGlide closure devices bilateral femoral arteries  Discharged Condition: good  HPI / Hospital Course:  Emily Krause is a 60 y.o. female who presents with an aneurysm associated with significant iliac artery occlusive with disabling claudication. The anatomy was suitable for endovascular repair.  Risks  and benefits of repair in an endovascular fashion were discussed and informed consent was obtained. Co-surgeons are used to expedite the procedure and reduce operative time as bilateral work needs to be done. On 05/02/21, the patient underwent:  US guidance for vascular access, bilateral femoral arteries Catheter placement into aorta from bilateral femoral approaches Intravascular ultrasound to bilateral iliac arteries and the aorta for measurements Placement of a 26 mm proximal conformable Gore Excluder Endoprosthesis main body right with a 23 mm x 12 cm left iliac contralateral limb Placement of an 18 mm x 10 cm right iliac extension limb ProGlide closure devices bilateral femoral arteries  The patient tolerated procedure well was transferred from the angiography suite to the ICU for observation overnight.  The patient's night of surgery was unremarkable.  During her brief inpatient stay, her diet was advanced, her Foley was removed, her pain was controlled with the use of p.o. pain medication she was ambulating at baseline.  Day of discharge, patient was afebrile with stable vital signs and essentially unremarkable physical exam.  Physical Exam:  Alert and oriented x3, no acute distress Cardiovascular: Regular rate and rhythm Pulmonary: Clear auscultation bilaterally Abdomen: Soft, non-tender, non-distended Right groin:  Access site: Clean dry and intact.  No swelling or drainage noted. Right groin:  Access site: Clean dry and intact.  No swelling or drainage noted. Extremity: Minimal edema. Warm distally to toes.  Labs: As below  Complications: None  Consults: None  Significant Diagnostic Studies: CBC Lab Results  Component Value Date   WBC 11.5 (H) 05/03/2021  HGB 8.4 (L) 05/03/2021   HCT 27.1 (L) 05/03/2021   MCV 70.0 (L) 05/03/2021   PLT 300 05/03/2021   BMET    Component Value Date/Time   NA 133 (L) 05/03/2021 0502   NA 137 07/13/2013 1349   K 3.6 05/03/2021 0502    K 3.4 (L) 07/13/2013 1349   CL 101 05/03/2021 0502   CL 102 07/13/2013 1349   CO2 27 05/03/2021 0502   CO2 27 07/13/2013 1349   GLUCOSE 130 (H) 05/03/2021 0502   GLUCOSE 132 (H) 07/13/2013 1349   BUN 7 05/03/2021 0502   BUN 13 07/13/2013 1349   CREATININE 0.48 05/03/2021 0502   CREATININE 0.81 07/13/2013 1349   CALCIUM 8.6 (L) 05/03/2021 0502   CALCIUM 9.6 07/13/2013 1349   GFRNONAA >60 05/03/2021 0502   GFRNONAA >60 07/13/2013 1349   GFRAA >60 07/13/2013 1349   COAG No results found for: INR, PROTIME  Disposition:  Discharge to :Home  Allergies as of 05/03/2021       Reactions   Codeine Other (See Comments)   headache Other reaction(s): Other (See Comments) Other reaction(s): severe HA   Lisinopril Other (See Comments)   Quinapril    Other reaction(s): Other (See Comments) Other reaction(s): ? myalgias   Metoprolol    Other reaction(s): Other (See Comments) Oral and pharynx blisters        Medication List     STOP taking these medications    oxyCODONE-acetaminophen 5-325 MG tablet Commonly known as: PERCOCET/ROXICET   traMADol 50 MG tablet Commonly known as: Ultram       TAKE these medications    aspirin 81 MG chewable tablet Chew 1 tablet by mouth daily.   atorvastatin 80 MG tablet Commonly known as: LIPITOR Take by mouth. What changed: Another medication with the same name was removed. Continue taking this medication, and follow the directions you see here.   carvedilol 3.125 MG tablet Commonly known as: COREG Take 3.125 mg by mouth 2 (two) times daily with a meal.   citalopram 10 MG tablet Commonly known as: CELEXA Take 10 mg by mouth daily.   clopidogrel 75 MG tablet Commonly known as: PLAVIX Take 1 tablet by mouth daily.   cyclobenzaprine 10 MG tablet Commonly known as: FLEXERIL Take 1 tablet by mouth every 8 (eight) hours as needed.   Janumet 50-1000 MG tablet Generic drug: sitaGLIPtin-metformin Take 1 tablet by mouth 2 (two)  times daily.   losartan-hydrochlorothiazide 100-25 MG tablet Commonly known as: HYZAAR Take 1 tablet by mouth daily.   Nitrostat 0.4 MG SL tablet Generic drug: nitroGLYCERIN Place under the tongue.   omeprazole 20 MG capsule Commonly known as: PRILOSEC Take 20 mg by mouth daily.   PARoxetine 40 MG tablet Commonly known as: PAXIL Take by mouth.   UNABLE TO FIND Oral diabetes medicine that replaced Metformin and Glipizide. Patient doesn't know name or strength.       ASK your doctor about these medications    amLODipine 10 MG tablet Commonly known as: NORVASC Take by mouth.   buPROPion 150 MG 24 hr tablet Commonly known as: WELLBUTRIN XL Take 150 mg by mouth daily.   glipiZIDE 5 MG tablet Commonly known as: GLUCOTROL Take 5 mg by mouth 2 (two) times daily before a meal.   insulin detemir 100 UNIT/ML injection Commonly known as: LEVEMIR Inject 30 Units into the skin daily.   linagliptin 5 MG Tabs tablet Commonly known as: TRADJENTA Take 5 mg by mouth daily.  metFORMIN 500 MG tablet Commonly known as: GLUCOPHAGE Take by mouth 2 (two) times daily with a meal.       Verbal and written Discharge instructions given to the patient. Wound care per Discharge AVS  Follow-up Information     Dew, Marlow Baars, MD Follow up in 1 month(s).   Specialties: Vascular Surgery, Radiology, Interventional Cardiology Why: Can see Dew or Vivia [place]. Will need AAA with visit.. called left message got voice mail. td Contact information: [address] [place] [address] [phone]                Signed: Tonette Lederer, PA-C 05/03/2021, 1:11 PM

## 2021-05-06 NOTE — Interval H&P Note (Signed)
History and Physical Interval Note:  05/06/2021 10:01 AM  Emily Krause  has presented today for surgery, with the diagnosis of Endovascular stent graft repair  GORE  Anesthesia    AAA Dr Wyn Quaker w Dr Gilda Crease to assist.  The various methods of treatment have been discussed with the patient and family. After consideration of risks, benefits and other options for treatment, the patient has consented to  Procedure(s): ENDOVASCULAR REPAIR/STENT GRAFT (N/A) as a surgical intervention.  The patient's history has been reviewed, patient examined, no change in status, stable for surgery.  I have reviewed the patient's chart and labs.  Questions were answered to the patient's satisfaction.     [name]

## 2021-05-28 ENCOUNTER — Other Ambulatory Visit (INDEPENDENT_AMBULATORY_CARE_PROVIDER_SITE_OTHER): Payer: Self-pay | Admitting: Vascular Surgery

## 2021-05-28 DIAGNOSIS — I714 Abdominal aortic aneurysm, without rupture, unspecified: Secondary | ICD-10-CM

## 2021-05-28 DIAGNOSIS — Z8679 Personal history of other diseases of the circulatory system: Secondary | ICD-10-CM

## 2021-05-31 ENCOUNTER — Ambulatory Visit (INDEPENDENT_AMBULATORY_CARE_PROVIDER_SITE_OTHER): Payer: Medicaid Other | Admitting: Nurse Practitioner

## 2021-05-31 ENCOUNTER — Other Ambulatory Visit (INDEPENDENT_AMBULATORY_CARE_PROVIDER_SITE_OTHER): Payer: Medicaid Other

## 2022-09-10 ENCOUNTER — Other Ambulatory Visit: Payer: Self-pay | Admitting: Primary Care

## 2022-09-10 DIAGNOSIS — Z1231 Encounter for screening mammogram for malignant neoplasm of breast: Secondary | ICD-10-CM

## 2022-12-13 IMAGING — US US EXTREM LOW VENOUS*R*
1 series · 13 of 24 positions shown · non-contrast
Comparison: None.

CLINICAL DATA: Right calf pain for the past 3 weeks. History of
smoking and cervical cancer. Patient is on anticoagulation. Evaluate
for DVT.



[Series 1: us venous img lower uni right (dvt) · portal-venous · 13 of 48 slices shown]
[im 1/48]
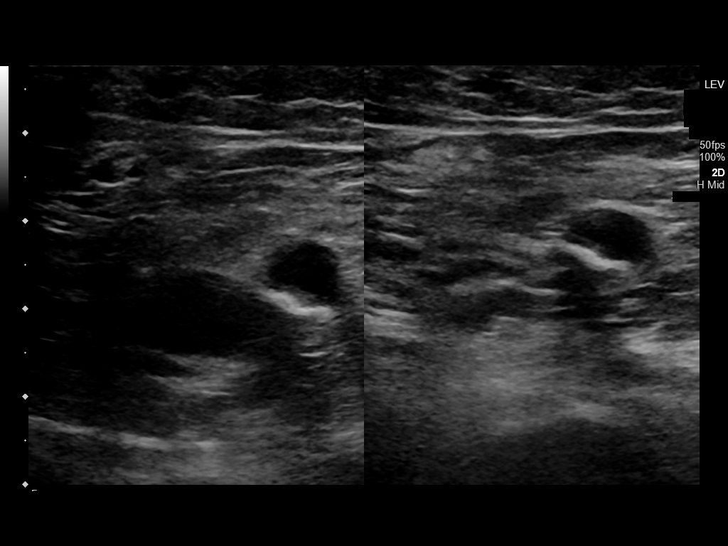
[im 5/48]
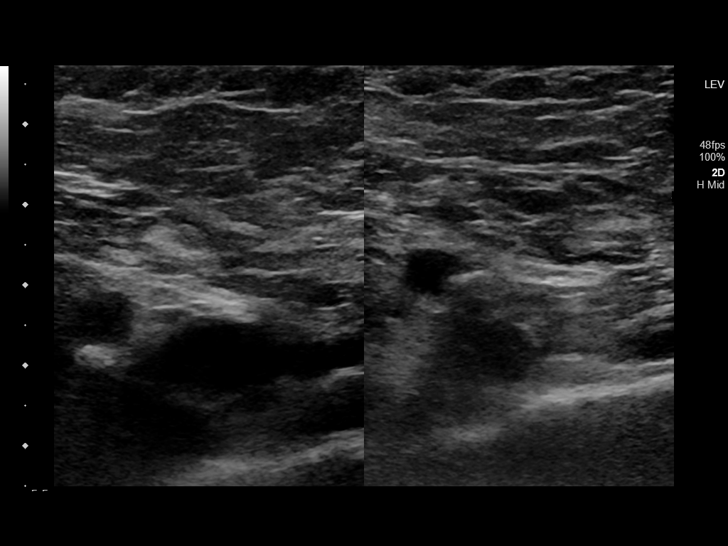
[im 9/48]
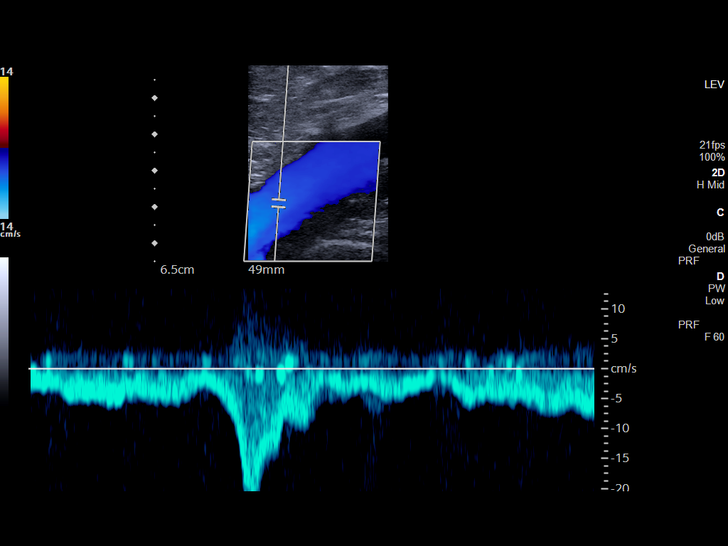
[im 13/48]
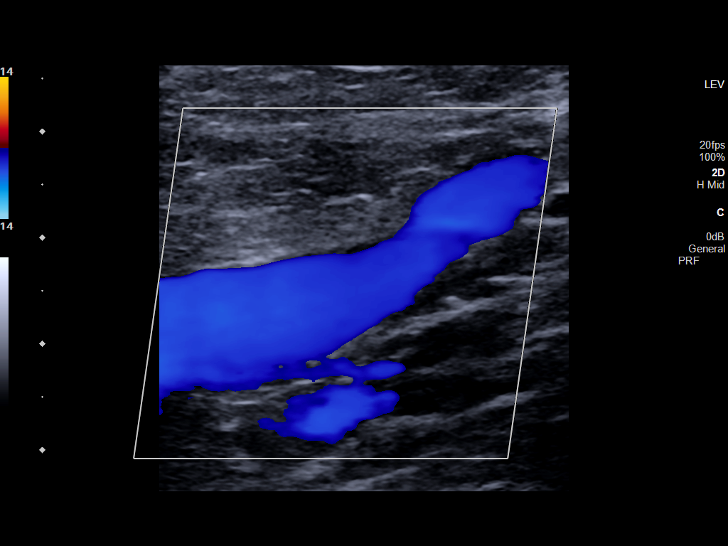
[im 17/48]
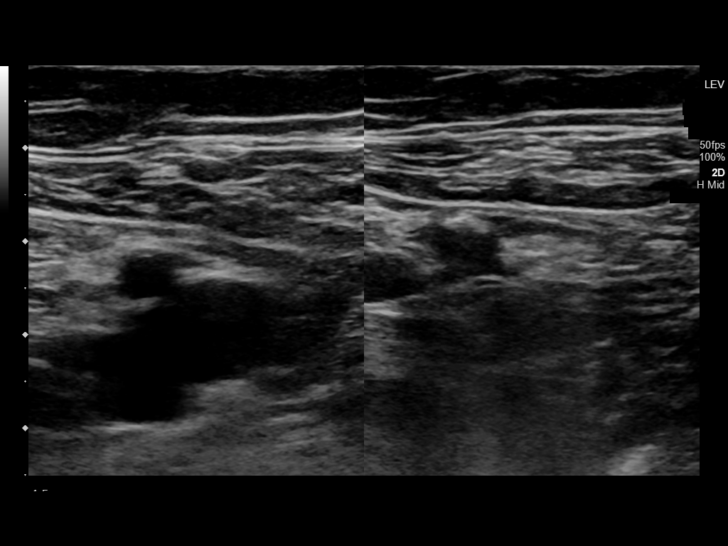
[im 21/48]
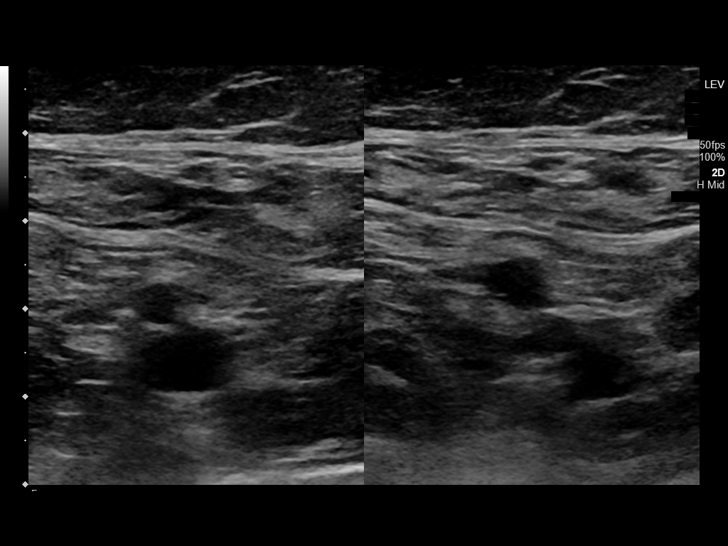
[im 25/48]
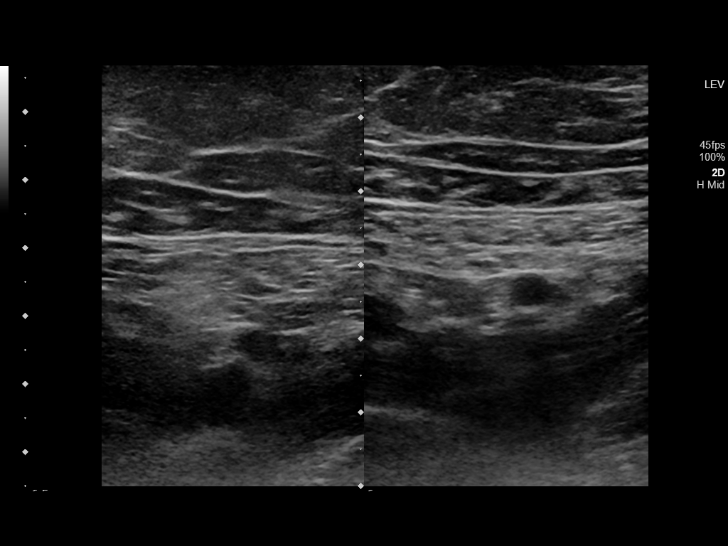
[im 27/48]
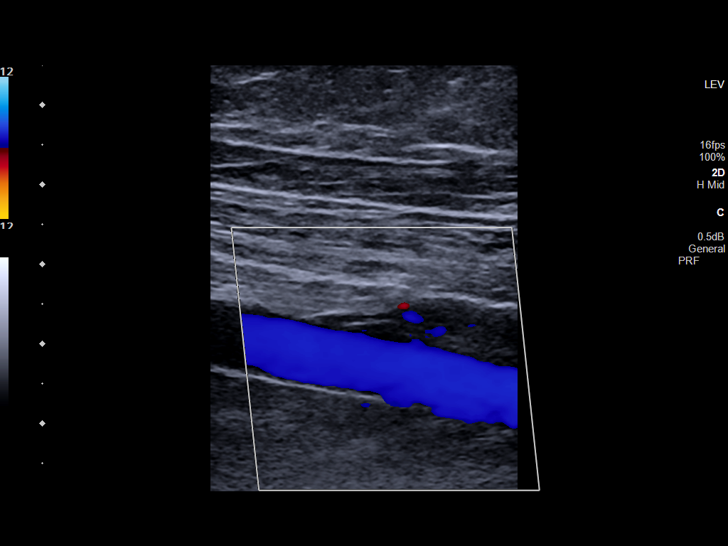
[im 31/48]
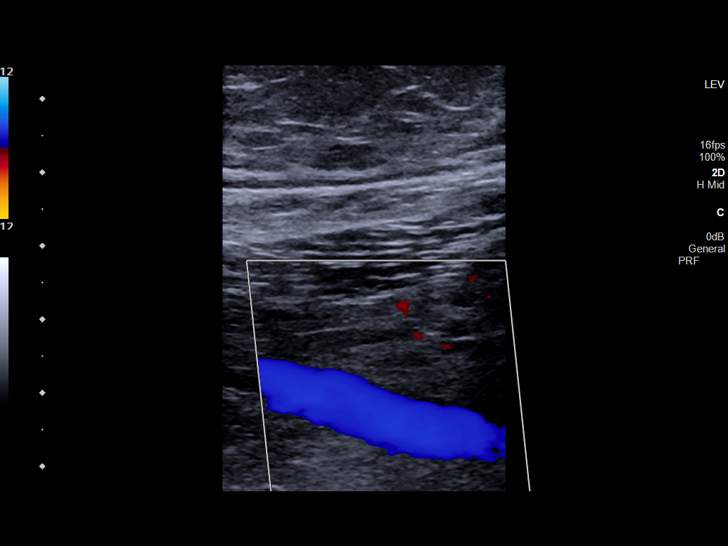
[im 35/48]
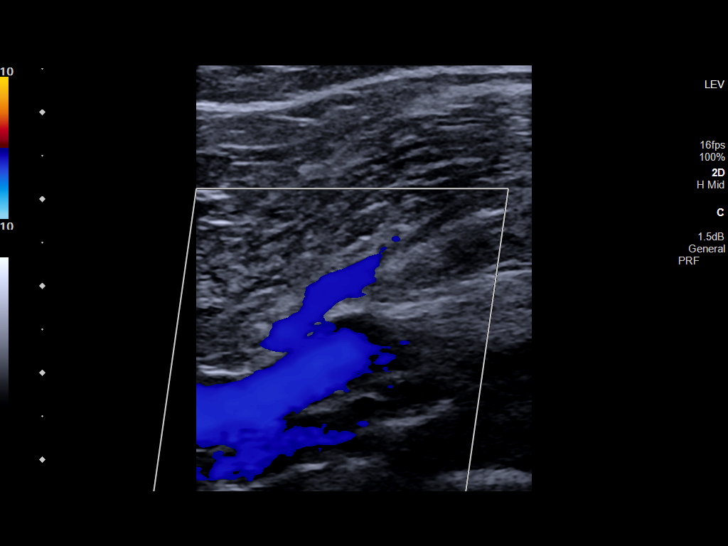
[im 39/48]
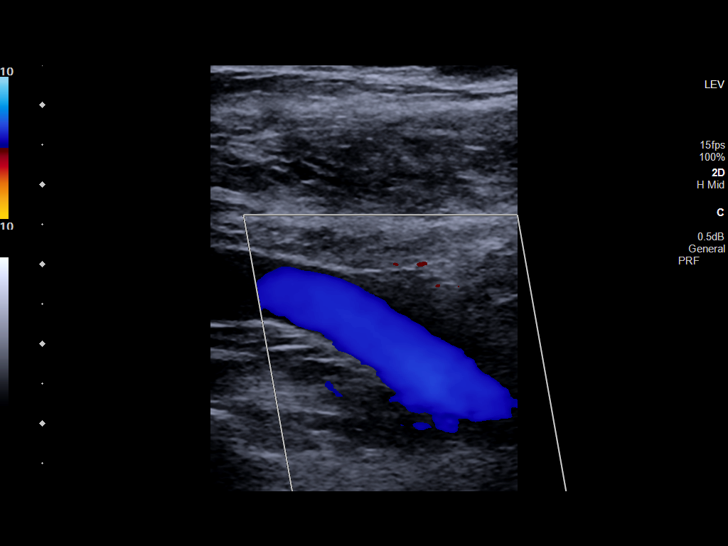
[im 43/48]
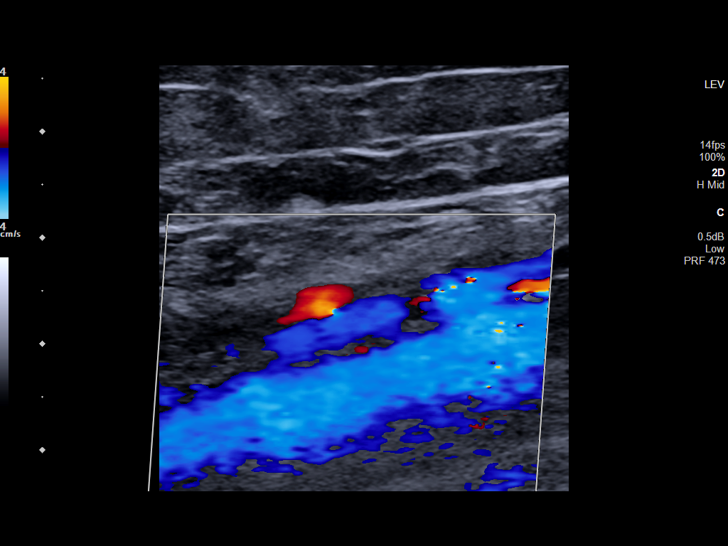
[im 48/48]
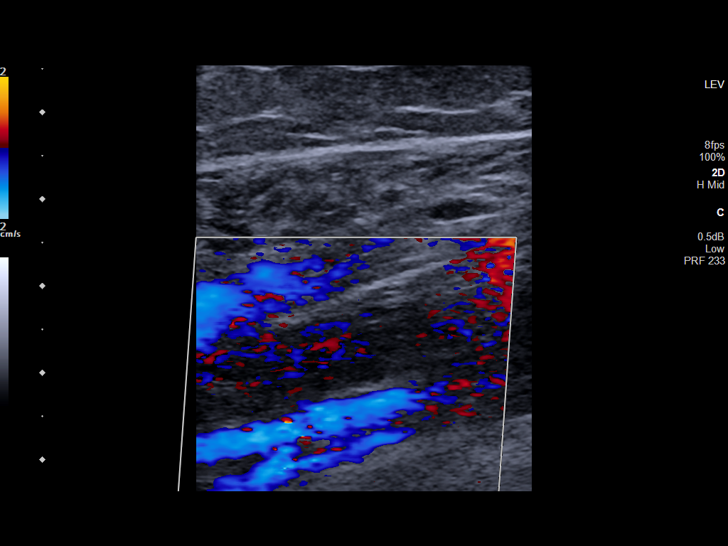

[13 of 24 positions shown; findings below may reference images not displayed]

FINDINGS: Contralateral Common Femoral Vein: Respiratory phasicity is normal
and symmetric with the symptomatic side. No evidence of thrombus.
Normal compressibility.

Common Femoral Vein: No evidence of thrombus. Normal
compressibility, respiratory phasicity and response to augmentation.

Saphenofemoral Junction: No evidence of thrombus. Normal
compressibility and flow on color Doppler imaging.

Profunda Femoral Vein: No evidence of thrombus. Normal
compressibility and flow on color Doppler imaging.

Femoral Vein: No evidence of thrombus. Normal compressibility,
respiratory phasicity and response to augmentation.

Popliteal Vein: No evidence of thrombus. Normal compressibility,
respiratory phasicity and response to augmentation.

Calf Veins: No evidence of thrombus. Normal compressibility and flow
on color Doppler imaging.

Superficial Great Saphenous Vein: No evidence of thrombus. Normal
compressibility.

Venous Reflux:  None.

Other Findings: Minimal amount of eccentric echogenic plaque is seen
within the incidentally imaged left common femoral artery (image 3)
as well as the proximal aspect of the right superficial femoral
artery (image 24).
IMPRESSION: 1. No evidence of DVT within the right lower extremity.
2. Incidental note made of atherosclerotic plaque involving the left
common femoral artery as well as the proximal aspect of the right
superficial femoral artery. Correlation for symptoms of PAD is
advised. Further evaluation with the acquisition of ABIs could be
performed as indicated.

## 2024-01-20 ENCOUNTER — Emergency Department

## 2024-01-20 ENCOUNTER — Emergency Department
Admission: EM | Admit: 2024-01-20 | Discharge: 2024-01-20 | Disposition: A | Discharge status: Home / Self Care | Attending: Emergency Medicine | Admitting: Emergency Medicine

## 2024-01-20 ENCOUNTER — Other Ambulatory Visit: Payer: Self-pay

## 2024-01-20 DIAGNOSIS — E119 Type 2 diabetes mellitus without complications: Secondary | ICD-10-CM | POA: Insufficient documentation

## 2024-01-20 DIAGNOSIS — R0781 Pleurodynia: Secondary | ICD-10-CM | POA: Diagnosis present

## 2024-01-20 DIAGNOSIS — Z7982 Long term (current) use of aspirin: Secondary | ICD-10-CM | POA: Insufficient documentation

## 2024-01-20 DIAGNOSIS — I10 Essential (primary) hypertension: Secondary | ICD-10-CM | POA: Diagnosis not present

## 2024-01-20 DIAGNOSIS — I2510 Atherosclerotic heart disease of native coronary artery without angina pectoris: Secondary | ICD-10-CM | POA: Insufficient documentation

## 2024-01-20 DIAGNOSIS — K439 Ventral hernia without obstruction or gangrene: Secondary | ICD-10-CM | POA: Insufficient documentation

## 2024-01-20 DIAGNOSIS — R109 Unspecified abdominal pain: Secondary | ICD-10-CM

## 2024-01-20 DIAGNOSIS — Y92410 Unspecified street and highway as the place of occurrence of the external cause: Secondary | ICD-10-CM | POA: Diagnosis not present

## 2024-01-20 DIAGNOSIS — Z7902 Long term (current) use of antithrombotics/antiplatelets: Secondary | ICD-10-CM | POA: Insufficient documentation

## 2024-01-20 DIAGNOSIS — R0789 Other chest pain: Secondary | ICD-10-CM

## 2024-01-20 LAB — BETA-HYDROXYBUTYRIC ACID: Beta-Hydroxybutyric Acid: 0.16 mmol/L (ref 0.05–0.27)

## 2024-01-20 LAB — BASIC METABOLIC PANEL WITH GFR
Anion gap: 16 — ABNORMAL HIGH (ref 5–15)
Anion gap: 11 (ref 5–15)
BUN: 9 mg/dL (ref 8–23)
BUN: 10 mg/dL (ref 8–23)
CO2: 18 mmol/L — ABNORMAL LOW (ref 22–32)
CO2: 26 mmol/L (ref 22–32)
Calcium: 9.2 mg/dL (ref 8.9–10.3)
Calcium: 9.1 mg/dL (ref 8.9–10.3)
Chloride: 99 mmol/L (ref 98–111)
Chloride: 98 mmol/L (ref 98–111)
Creatinine, Ser: 0.68 mg/dL (ref 0.44–1.00)
Creatinine, Ser: 0.59 mg/dL (ref 0.44–1.00)
GFR, Estimated: >60 mL/min (ref >60)
GFR, Estimated: >60 mL/min (ref >60)
Glucose, Bld: 153 mg/dL — ABNORMAL HIGH (ref 70–99)
Glucose, Bld: 111 mg/dL — ABNORMAL HIGH (ref 70–99)
Potassium: 3.9 mmol/L (ref 3.5–5.1)
Potassium: 4.3 mmol/L (ref 3.5–5.1)
Sodium: 133 mmol/L — ABNORMAL LOW (ref 135–145)
Sodium: 135 mmol/L (ref 135–145)

## 2024-01-20 LAB — TROPONIN I (HIGH SENSITIVITY)
Troponin I (High Sensitivity): 4 ng/L (ref <18)
Troponin I (High Sensitivity): 4 ng/L (ref <18)

## 2024-01-20 LAB — CBC
HCT: 42.9 % (ref 36.0–46.0)
Hemoglobin: 14.3 g/dL (ref 12.0–15.0)
MCH: 29.4 pg (ref 26.0–34.0)
MCHC: 33.3 g/dL (ref 30.0–36.0)
MCV: 88.1 fL (ref 80.0–100.0)
Platelets: 293 10*3/uL (ref 150–400)
RBC: 4.87 MIL/uL (ref 3.87–5.11)
RDW: 14.5 % (ref 11.5–15.5)
WBC: 12.4 10*3/uL — ABNORMAL HIGH (ref 4.0–10.5)
nRBC: 0 % (ref 0.0–0.2)

## 2024-01-20 MED ORDER — LIDOCAINE 5 % EX PTCH: 1.0000 | MEDICATED_PATCH | CUTANEOUS | 0 refills | Status: AC

## 2024-01-20 MED ORDER — OXYCODONE HCL 5 MG PO CAPS: 5.0000 mg | ORAL_CAPSULE | Freq: Four times a day (QID) | ORAL | 0 refills | Status: DC | PRN

## 2024-01-20 MED ORDER — IOHEXOL 350 MG/ML SOLN
100.0000 mL | Freq: Once | INTRAVENOUS | Status: AC | PRN
  Administered 2024-01-20: 100 mL via INTRAVENOUS

## 2024-01-20 MED ORDER — OXYCODONE HCL 5 MG PO TABS
5.0000 mg | ORAL_TABLET | Freq: Once | ORAL | Status: AC
  Administered 2024-01-20: 5 mg via ORAL
  Filled 2024-01-20: qty 1

## 2024-01-20 MED ORDER — LACTATED RINGERS IV BOLUS
1000.0000 mL | Freq: Once | INTRAVENOUS | Status: AC
  Administered 2024-01-20: 1000 mL via INTRAVENOUS

## 2024-01-20 MED ORDER — ACETAMINOPHEN 500 MG PO TABS
1000.0000 mg | ORAL_TABLET | Freq: Once | ORAL | Status: AC
  Administered 2024-01-20: 1000 mg via ORAL
  Filled 2024-01-20: qty 2
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
## 2024-01-21 ENCOUNTER — Telehealth: Payer: Self-pay | Admitting: Emergency Medicine

## 2024-01-21 MED ORDER — OXYCODONE HCL 5 MG PO TABS: 5.0000 mg | ORAL_TABLET | Freq: Three times a day (TID) | ORAL | 0 refills | Status: AC | PRN

## 2024-01-21 MED ORDER — OXYCODONE HCL 5 MG PO CAPS: 5.0000 mg | ORAL_CAPSULE | Freq: Four times a day (QID) | ORAL | 0 refills | Status: DC | PRN

## 2024-01-21 NOTE — Telephone Encounter (Signed)
-----------------------------------------   2:02 PM 01/21/24 ----------------------------------------- [facility] pharmacy does not have narcotics.  I have changed the patient's prescription for her oxycodone  to the [facility] in [place]

## 2024-01-21 NOTE — Addendum Note (Signed)
 Addended by: Bettyjane Shenoy, MICHEAL A on: 01/21/2024 04:47 PM   Modules accepted: Orders

## 2024-01-21 NOTE — Telephone Encounter (Signed)
 Updated by ER clerk that patient's pharmacy does not have oxycodone  capsules that were originally ordered by Dr. Drenda Gentle and subsequently reordered by Dr. Azalee Bolds to a new pharmacy.  Switched from 5 mg oxycodone  capsules (8 capsules) to 5 mg oxycodone  tablets (8 tablets).
